# Patient Record
Sex: Female | Born: 1990 | Race: White | Hispanic: No | Smoking: Never smoker
Health system: Southern US, Community
[De-identification: ages and names within clinical notes are randomized; demographics above are authoritative.]

## PROBLEM LIST (undated history)

## (undated) DIAGNOSIS — F32A Depression, unspecified: Secondary | ICD-10-CM

## (undated) DIAGNOSIS — M859 Disorder of bone density and structure, unspecified: Secondary | ICD-10-CM

## (undated) DIAGNOSIS — F419 Anxiety disorder, unspecified: Secondary | ICD-10-CM

## (undated) DIAGNOSIS — M8580 Other specified disorders of bone density and structure, unspecified site: Secondary | ICD-10-CM

## (undated) HISTORY — PX: NASAL SINUS SURGERY: SHX719

---

## 2017-03-04 LAB — PULMONARY FUNCTION TEST

## 2017-03-19 LAB — PULMONARY FUNCTION TEST

## 2018-12-10 ENCOUNTER — Other Ambulatory Visit: Payer: Self-pay

## 2018-12-10 DIAGNOSIS — Z20822 Contact with and (suspected) exposure to covid-19: Secondary | ICD-10-CM

## 2018-12-12 LAB — NOVEL CORONAVIRUS, NAA: SARS-CoV-2, NAA: NOT DETECTED

## 2019-05-14 ENCOUNTER — Ambulatory Visit: Payer: Self-pay | Attending: Internal Medicine

## 2019-05-24 ENCOUNTER — Other Ambulatory Visit: Payer: Self-pay

## 2019-05-24 ENCOUNTER — Ambulatory Visit: Payer: Self-pay | Attending: Internal Medicine

## 2019-05-24 DIAGNOSIS — Z23 Encounter for immunization: Secondary | ICD-10-CM

## 2019-05-24 NOTE — Progress Notes (Signed)
   Covid-19 Vaccination Clinic  Name:  [name]    MRN: [id number] DOB: 07/10/1990  05/24/2019  [name] was observed post Covid-19 immunization for 15 minutes without incident. She was provided with Vaccine Information Sheet and instruction to access the V-Safe system.   [name] was instructed to call 911 with any severe reactions post vaccine: Marland Kitchen Difficulty breathing  . Swelling of face and throat  . A fast heartbeat  . A bad rash all over body  . Dizziness and weakness   Immunizations Administered    Name Date Dose VIS Date Route   Pfizer COVID-19 Vaccine 05/24/2019 11:06 AM 0.3 mL 01/22/2019 Intramuscular   Manufacturer: [employer], [employer]   Lot: [id number]   NDC: [address]-5

## 2019-06-16 ENCOUNTER — Ambulatory Visit: Payer: Self-pay | Attending: Internal Medicine

## 2019-06-16 DIAGNOSIS — Z23 Encounter for immunization: Secondary | ICD-10-CM

## 2019-06-16 NOTE — Progress Notes (Signed)
   Covid-19 Vaccination Clinic  Name:  Leslie Cervantes    MRN: [id number] DOB: 06-07-1990  06/16/2019  [name] was observed post Covid-19 immunization for 15 minutes without incident. She was provided with Vaccine Information Sheet and instruction to access the V-Safe system.   [name] was instructed to call 911 with any severe reactions post vaccine: Marland Kitchen Difficulty breathing  . Swelling of face and throat  . A fast heartbeat  . A bad rash all over body  . Dizziness and weakness   Immunizations Administered    Name Date Dose VIS Date Route   Pfizer COVID-19 Vaccine 06/16/2019  8:21 AM 0.3 mL 04/07/2018 Intramuscular   Manufacturer: [employer], [employer]   Lot: [id number]   NDC: [address]-6

## 2020-04-04 ENCOUNTER — Other Ambulatory Visit: Payer: Self-pay

## 2020-04-04 ENCOUNTER — Ambulatory Visit (HOSPITAL_COMMUNITY)
Admission: EM | Admit: 2020-04-04 | Discharge: 2020-04-04 | Disposition: A | Discharge status: Home / Self Care | Payer: 59

## 2020-04-04 ENCOUNTER — Encounter (HOSPITAL_COMMUNITY): Payer: Self-pay | Admitting: Emergency Medicine

## 2020-04-04 DIAGNOSIS — J209 Acute bronchitis, unspecified: Secondary | ICD-10-CM

## 2020-04-04 DIAGNOSIS — J3089 Other allergic rhinitis: Secondary | ICD-10-CM

## 2020-04-04 HISTORY — DX: Other specified disorders of bone density and structure, unspecified site: M85.80

## 2020-04-04 HISTORY — DX: Anxiety disorder, unspecified: F41.9

## 2020-04-04 HISTORY — DX: Disorder of bone density and structure, unspecified: M85.9

## 2020-04-04 HISTORY — DX: Depression, unspecified: F32.A

## 2020-04-04 MED ORDER — BUDESONIDE-FORMOTEROL FUMARATE 160-4.5 MCG/ACT IN AERO: 2.0000 | INHALATION_SPRAY | Freq: Two times a day (BID) | RESPIRATORY_TRACT | 3 refills | Status: DC

## 2020-04-04 MED ORDER — MONTELUKAST SODIUM 10 MG PO TABS: 10.0000 mg | ORAL_TABLET | Freq: Every day | ORAL | 2 refills | Status: DC

## 2020-04-04 MED ORDER — ALBUTEROL SULFATE HFA 108 (90 BASE) MCG/ACT IN AERS: 1.0000 | INHALATION_SPRAY | Freq: Four times a day (QID) | RESPIRATORY_TRACT | 1 refills | Status: AC | PRN

## 2020-04-04 MED ORDER — PREDNISONE 20 MG PO TABS: 40.0000 mg | ORAL_TABLET | Freq: Every day | ORAL | 0 refills | Status: DC

## 2020-04-04 NOTE — ED Triage Notes (Signed)
Cough, sinus drainage and sob intermittently for 5-6 weeks.  Symptoms worsen at night.  covid test 2 weeks ago, negative per patient

## 2020-04-04 NOTE — ED Provider Notes (Signed)
[facility]    CSN: [id number] Arrival date & time: 04/04/20  2671      History   Chief Complaint Chief Complaint  Patient presents with  . Cough    HPI Leslie Cervantes is a 30 y.o. female.   Here today with 5-6 weeks of cough, sinus drainage and pressure, intermittent episodes of SOB. Denies fever, chills, body aches, CP, abdominal pain, N/V/D. Has known hx of seasonal allergies, takes OTC antihistamine and nasal spray nearly daily. Also has an albuterol inhaler she has been using with some relief as well but notes it is expired. Has never been diagnosed with asthma that she is aware of. No known sick contacts.      Past Medical History:  Diagnosis Date  . Anxiety   . Depression   . Low bone density     There are no problems to display for this patient.   Past Surgical History:  Procedure Laterality Date  . NASAL SINUS SURGERY      OB History   No obstetric history on file.      Home Medications    Prior to Admission medications   Medication Sig Start Date End Date Taking? Authorizing Provider  albuterol (VENTOLIN HFA) 108 (90 Base) MCG/ACT inhaler Inhale 1-2 puffs into the lungs every 6 (six) hours as needed for wheezing or shortness of breath. 04/04/20  Yes Particia Nearing, PA-C  ALBUTEROL IN Inhale into the lungs.   Yes [provider]  budesonide-formoterol (SYMBICORT) 160-4.5 MCG/ACT inhaler Inhale 2 puffs into the lungs 2 (two) times daily. 04/04/20  Yes Particia Nearing, PA-C  buPROPion (WELLBUTRIN XL) 300 MG 24 hr tablet Take 300 mg by mouth daily. 02/21/20  Yes [provider]  busPIRone (BUSPAR) 10 MG tablet Take 10 mg by mouth 3 (three) times daily.   Yes [provider]  Calcium Carb-Cholecalciferol (CALCIUM 500/D PO) Take by mouth.   Yes [provider]  cetirizine (ZYRTEC) 10 MG tablet Take 10 mg by mouth daily.   Yes [provider]  fluticasone (VERAMYST) 27.5 MCG/SPRAY nasal  spray Place 2 sprays into the nose daily.   Yes [provider]  ipratropium (ATROVENT) 0.03 % nasal spray Place 2 sprays into both nostrils every 12 (twelve) hours.   Yes [provider]  montelukast (SINGULAIR) 10 MG tablet Take 1 tablet (10 mg total) by mouth at bedtime. 04/04/20  Yes Particia Nearing, PA-C  predniSONE (DELTASONE) 20 MG tablet Take 2 tablets (40 mg total) by mouth daily with breakfast. 04/04/20  Yes Particia Nearing, PA-C  sertraline (ZOLOFT) 50 MG tablet Take 50 mg by mouth daily.   Yes [provider]  VITAMIN D PO Take by mouth.   Yes [provider]    Family History No family history on file.  Social History Social History   Tobacco Use  . Smoking status: Never Smoker  Substance Use Topics  . Alcohol use: Yes  . Drug use: Never     Allergies   Patient has no known allergies.   Review of Systems Review of Systems PER HPI    Physical Exam Triage Vital Signs ED Triage Vitals  Enc Vitals Group     BP 04/04/20 0853 118/75     Pulse Rate 04/04/20 0853 93     Resp 04/04/20 0853 18     Temp 04/04/20 0853 98.3 F (36.8 C)     Temp Source 04/04/20 0853 Oral  SpO2 04/04/20 0853 100 %     Weight --      Height --      Head Circumference --      Peak Flow --      Pain Score 04/04/20 0845 0     Pain Loc --      Pain Edu? --      Excl. in GC? --    No data found.  Updated Vital Signs BP 118/75 (BP Location: Left Arm)   Pulse 93   Temp 98.3 F (36.8 C) (Oral)   Resp 18   LMP 04/04/2020   SpO2 100%   Visual Acuity Right Eye Distance:   Left Eye Distance:   Bilateral Distance:    Right Eye Near:   Left Eye Near:    Bilateral Near:     Physical Exam Vitals and nursing note reviewed.  Constitutional:      Appearance: Normal appearance. She is not ill-appearing.  HENT:     Head: Atraumatic.     Right Ear: Tympanic membrane normal.     Left Ear: Tympanic membrane normal.     Nose:  Rhinorrhea present.     Mouth/Throat:     Mouth: Mucous membranes are moist.     Pharynx: Oropharynx is clear. Posterior oropharyngeal erythema present.  Eyes:     Extraocular Movements: Extraocular movements intact.     Conjunctiva/sclera: Conjunctivae normal.  Cardiovascular:     Rate and Rhythm: Normal rate and regular rhythm.     Heart sounds: Normal heart sounds.  Pulmonary:     Effort: Pulmonary effort is normal. No respiratory distress.     Breath sounds: Normal breath sounds. No wheezing or rales.  Abdominal:     General: Bowel sounds are normal. There is no distension.     Palpations: Abdomen is soft.     Tenderness: There is no abdominal tenderness. There is no guarding.  Musculoskeletal:        General: Normal range of motion.     Cervical back: Normal range of motion and neck supple.  Skin:    General: Skin is warm and dry.  Neurological:     Mental Status: She is alert and oriented to person, place, and time.  Psychiatric:        Mood and Affect: Mood normal.        Thought Content: Thought content normal.        Judgment: Judgment normal.     UC Treatments / Results  Labs (all labs ordered are listed, but only abnormal results are displayed) Labs Reviewed - No data to display  EKG   Radiology No results found.  Procedures Procedures (including critical care time)  Medications Ordered in UC Medications - No data to display  Initial Impression / Assessment and Plan / UC Course  I have reviewed the triage vital signs and the nursing notes.  Pertinent labs & imaging results that were available during my care of the patient were reviewed by me and considered in my medical decision making (see chart for details).     Exam and vitals very reassuring today, suspect allergic rhinitis exacerbation causing her ongoing sxs. Will treat with short prednisone burst, add singulair, and increase allergy regimen to BID until improved. Albuterol inhaler sent, and  symbicort per her request for better control. Follow up with PCP for recheck of sxs.    Final Clinical Impressions(s) / UC Diagnoses   Final diagnoses:  Acute bronchitis, unspecified organism  Seasonal allergic rhinitis due to other allergic trigger   Discharge Instructions   None    ED Prescriptions    Medication Sig Dispense Auth. Provider   budesonide-formoterol (SYMBICORT) 160-4.5 MCG/ACT inhaler Inhale 2 puffs into the lungs 2 (two) times daily. 1 each Particia Nearing, PA-C   albuterol (VENTOLIN HFA) 108 (90 Base) MCG/ACT inhaler Inhale 1-2 puffs into the lungs every 6 (six) hours as needed for wheezing or shortness of breath. 18 g Particia Nearing, PA-C   montelukast (SINGULAIR) 10 MG tablet Take 1 tablet (10 mg total) by mouth at bedtime. 30 tablet Particia Nearing, [place]   predniSONE (DELTASONE) 20 MG tablet Take 2 tablets (40 mg total) by mouth daily with breakfast. 10 tablet Particia Nearing, [place]     PDMP not reviewed this encounter.   Particia Nearing, [place] 04/04/20 1731

## 2020-05-05 ENCOUNTER — Ambulatory Visit: Payer: 59 | Admitting: Internal Medicine

## 2020-05-05 ENCOUNTER — Encounter: Payer: Self-pay | Admitting: Internal Medicine

## 2020-05-05 ENCOUNTER — Ambulatory Visit (INDEPENDENT_AMBULATORY_CARE_PROVIDER_SITE_OTHER): Payer: 59 | Admitting: Internal Medicine

## 2020-05-05 ENCOUNTER — Other Ambulatory Visit: Payer: Self-pay

## 2020-05-05 DIAGNOSIS — F331 Major depressive disorder, recurrent, moderate: Secondary | ICD-10-CM

## 2020-05-05 DIAGNOSIS — F411 Generalized anxiety disorder: Secondary | ICD-10-CM

## 2020-05-05 DIAGNOSIS — R059 Cough, unspecified: Secondary | ICD-10-CM

## 2020-05-05 DIAGNOSIS — F329 Major depressive disorder, single episode, unspecified: Secondary | ICD-10-CM | POA: Insufficient documentation

## 2020-05-05 MED ORDER — VORTIOXETINE HBR 20 MG PO TABS: 10.0000 mg | ORAL_TABLET | Freq: Every day | ORAL | 3 refills | Status: DC

## 2020-05-05 MED ORDER — SERTRALINE HCL 50 MG PO TABS
50.0000 mg | ORAL_TABLET | Freq: Every day | ORAL | 0 refills | Status: DC
Start: 2020-05-05 — End: 2020-06-15

## 2020-05-05 MED ORDER — FLUTICASONE PROPIONATE 50 MCG/ACT NA SUSP: 2.0000 | Freq: Every day | NASAL | 3 refills | Status: AC

## 2020-05-05 NOTE — Patient Instructions (Addendum)
We have sent in trintellix to start taking 1/2 pill daily for the first week. Then increase to 1 pill daily thereafter. We will have you stop the sertraline (zoloft) and the buspar (buspirone) and not take this starting when you start trintellix. Keep taking the wellbutrin for [redacted] weeks along with trintellix. Then stop wellbutrin and continue on trintellix alone.  Let us know in about 2 weeks how you are doing either at a visit or with a mychart message/phone call.   [facility] to get checked out for the attention. [phone]

## 2020-05-05 NOTE — Progress Notes (Signed)
   Subjective:   Patient ID: Leslie Cervantes, female    DOB: 06-17-1990, 30 y.o.   MRN: [id number]  HPI The patient is a new 30 YO female coming in for concerns about recent diagnosis bronchitis (diagnosed at urgent care about a month ago, still coughing some, given prednisone and albuterol and symbicort, she did not get symbicort due to cost, also singulair which she is taking, overall feels better than before) and concentration problems (has an old prescription of adderall which she uses rarely, with insurance could not afford a better medication, this causes worsening anxiety and appetite suppression so she tries not to use, is currently in grad school) and depression (tried prozac and lexapro in the past which did not work, does not remember any side effects, currently taking buspar and wellbutrin and sertraline, denies true SI some thoughts of not wanting to deal with her state, no plans and would not act on this).   PMH, [facility], social history reviewed and updated  Review of Systems  Constitutional: Negative.   HENT: Positive for congestion and tinnitus.   Eyes: Negative.   Respiratory: Positive for cough. Negative for chest tightness and shortness of breath.   Cardiovascular: Negative for chest pain, palpitations and leg swelling.  Gastrointestinal: Negative for abdominal distention, abdominal pain, constipation, diarrhea, nausea and vomiting.  Musculoskeletal: Negative.   Skin: Negative.   Neurological: Negative.   Psychiatric/Behavioral: Positive for decreased concentration, dysphoric mood and sleep disturbance. The patient is nervous/anxious.     Objective:  Physical Exam Constitutional:      Appearance: She is well-developed.  HENT:     Head: Normocephalic and atraumatic.     Right Ear: Tympanic membrane normal.     Left Ear: Tympanic membrane normal.  Cardiovascular:     Rate and Rhythm: Normal rate and regular rhythm.  Pulmonary:     Effort: Pulmonary effort is normal. No  respiratory distress.     Breath sounds: Normal breath sounds. No wheezing or rales.  Abdominal:     General: Bowel sounds are normal. There is no distension.     Palpations: Abdomen is soft.     Tenderness: There is no abdominal tenderness. There is no rebound.  Musculoskeletal:     Cervical back: Normal range of motion.  Skin:    General: Skin is warm and dry.  Neurological:     Mental Status: She is alert and oriented to person, place, and time.     Coordination: Coordination normal.     Vitals:   05/05/20 1443  BP: 112/70  Pulse: 86  Resp: 18  Temp: 98.8 F (37.1 C)  TempSrc: Oral  SpO2: 96%  Weight: 117 lb 3.2 oz (53.2 kg)  Height: 5\' 4"  (1.626 m)    This visit occurred during the SARS-CoV-2 public health emergency.  Safety protocols were in place, including screening questions prior to the visit, additional usage of staff PPE, and extensive cleaning of exam room while observing appropriate contact time as indicated for disinfecting solutions.   Assessment & Plan:

## 2020-05-05 NOTE — Assessment & Plan Note (Addendum)
We will try to simplify regimen with adding trintellix. Stopping buspar and sertraline. Will keep wellbutrin for 2 weeks and then stop if able. Suspect this could be the underlying cause of the concentration and asked her to go to [place] [facility] for testing to see if she truly has ADD.

## 2020-05-05 NOTE — Assessment & Plan Note (Signed)
Rx trintellix, stop buspar and sertraline. In about 2 weeks once at full dose trintellix will stop wellbutrin. Plan for single agent therapy if able. She will reach out in 2 weeks to let us know how she is doing or sooner if needed. She was in counseling but this is not financially feasible right now.

## 2020-05-05 NOTE — Assessment & Plan Note (Signed)
Suspect post-viral and allergic. Continue zyrtec, flonase and singulair. Lungs clear on exam and x-ray not indicated. If still present in 2 months at follow up can consider.

## 2020-05-29 ENCOUNTER — Telehealth: Payer: Self-pay

## 2020-05-29 ENCOUNTER — Telehealth: Payer: Self-pay | Admitting: Internal Medicine

## 2020-05-29 NOTE — Telephone Encounter (Signed)
Noted  

## 2020-05-29 NOTE — Telephone Encounter (Signed)
vortioxetine HBr (TRINTELLIX) 20 MG TABS tablet Patient states Dr. Okey Dupre was doing a PA for this medication but she hasn't heard anything so she wanted to check on the status of the PA

## 2020-05-29 NOTE — Telephone Encounter (Signed)
PA KEY:  [id number]  Trintellix (formerly Brintellix) 20MG  tablets  Waiting on decision.  Called pt to inform her of the progress of PA.

## 2020-06-01 ENCOUNTER — Telehealth: Payer: Self-pay

## 2020-06-01 NOTE — Telephone Encounter (Signed)
Key: [id number]  Prior Auth for vortioxetine 20 mg tablet was denied.  Patient notified via Mychart.

## 2020-06-02 NOTE — Telephone Encounter (Signed)
Did she fill this and start taking it?

## 2020-06-06 ENCOUNTER — Ambulatory Visit: Payer: 59 | Admitting: Internal Medicine

## 2020-06-09 NOTE — Telephone Encounter (Signed)
Patient called and was wondering what the next step is since her PA was denied. She can be reached at [phone]. Please advise

## 2020-06-15 ENCOUNTER — Other Ambulatory Visit: Payer: Self-pay | Admitting: Internal Medicine

## 2020-06-15 MED ORDER — SERTRALINE HCL 50 MG PO TABS: 50.0000 mg | ORAL_TABLET | Freq: Every day | ORAL | 2 refills | Status: DC

## 2020-06-19 ENCOUNTER — Encounter: Payer: Self-pay | Admitting: Internal Medicine

## 2020-06-19 ENCOUNTER — Ambulatory Visit: Payer: 59 | Admitting: Internal Medicine

## 2020-06-20 ENCOUNTER — Other Ambulatory Visit: Payer: Self-pay | Admitting: Internal Medicine

## 2020-06-20 NOTE — Telephone Encounter (Signed)
1.Medication Requested: buPROPion (WELLBUTRIN XL) 300 MG 24 hr tablet    2. Pharmacy (Name, Street, [place]): [facility] [address] [place] - [address]  3. On Med List: yes   4. Last Visit with PCP: 05-05-20  5. Next visit date with PCP: 07-14-20   Agent: Please be advised that RX refills may take up to 3 business days. We ask that you follow-up with your pharmacy.

## 2020-06-21 MED ORDER — BUPROPION HCL ER (XL) 300 MG PO TB24: 300.0000 mg | ORAL_TABLET | Freq: Every day | ORAL | 2 refills | Status: DC

## 2020-06-23 NOTE — Telephone Encounter (Signed)
Patient called and is requesting a call back in regards to the message below

## 2020-06-29 ENCOUNTER — Other Ambulatory Visit: Payer: Self-pay | Admitting: Family Medicine

## 2020-06-29 NOTE — Telephone Encounter (Signed)
   Notes to clinic Not a provider we approve rx for.  

## 2020-07-12 NOTE — Telephone Encounter (Signed)
See request, thanks

## 2020-07-14 ENCOUNTER — Encounter: Payer: Self-pay | Admitting: Internal Medicine

## 2020-07-14 ENCOUNTER — Ambulatory Visit (INDEPENDENT_AMBULATORY_CARE_PROVIDER_SITE_OTHER): Payer: 59 | Admitting: Internal Medicine

## 2020-07-14 ENCOUNTER — Other Ambulatory Visit: Payer: Self-pay

## 2020-07-14 VITALS — BP 108/80 | HR 94 | Temp 98.1°F | Resp 18 | Ht 64.0 in | Wt 117.2 lb

## 2020-07-14 DIAGNOSIS — F331 Major depressive disorder, recurrent, moderate: Secondary | ICD-10-CM

## 2020-07-14 DIAGNOSIS — J3089 Other allergic rhinitis: Secondary | ICD-10-CM

## 2020-07-14 MED ORDER — SERTRALINE HCL 50 MG PO TABS: 100.0000 mg | ORAL_TABLET | Freq: Every day | ORAL | 5 refills | Status: DC

## 2020-07-14 NOTE — Progress Notes (Signed)
   Subjective:   Patient ID: Leslie Cervantes, female    DOB: 04-24-1990, 30 y.o.   MRN: [id number]  HPI The patient is a 30 YO female coming in for anxiety/depression. We had tried to get trintellix but this was not approved by insurance. We then decided to go up on wellbutrin to 450 mg and keep zoloft 50 mg daily. She has done this for a week or so. This has not changed her symptoms. She would like to maybe get off wellbutrin as she does not feel it is working great for her. Has been on prozac and lexapro in the past without relief.   Review of Systems  Constitutional: Negative.   HENT: Negative.   Eyes: Negative.   Respiratory: Negative for cough, chest tightness and shortness of breath.   Cardiovascular: Negative for chest pain, palpitations and leg swelling.  Gastrointestinal: Negative for abdominal distention, abdominal pain, constipation, diarrhea, nausea and vomiting.  Musculoskeletal: Negative.   Skin: Negative.   Neurological: Negative.   Psychiatric/Behavioral: Positive for decreased concentration and dysphoric mood.    Objective:  Physical Exam Constitutional:      Appearance: Normal appearance.  HENT:     Head: Normocephalic and atraumatic.  Pulmonary:     Effort: Pulmonary effort is normal.  Abdominal:     General: There is no distension.  Musculoskeletal:     Cervical back: Normal range of motion.  Skin:    General: Skin is warm and dry.  Neurological:     General: No focal deficit present.     Mental Status: She is alert and oriented to person, place, and time.  Psychiatric:        Mood and Affect: Mood normal.        Behavior: Behavior normal.     Vitals:   07/14/20 1103  BP: 108/80  Pulse: 94  Resp: 18  Temp: 98.1 F (36.7 C)  TempSrc: Oral  SpO2: 98%  Weight: 117 lb 3.2 oz (53.2 kg)  Height: 5\' 4"  (1.626 m)    This visit occurred during the SARS-CoV-2 public health emergency.  Safety protocols were in place, including screening questions prior  to the visit, additional usage of staff PPE, and extensive cleaning of exam room while observing appropriate contact time as indicated for disinfecting solutions.   Assessment & Plan:

## 2020-07-14 NOTE — Assessment & Plan Note (Signed)
Will decrease wellbutrin to 300 mg daily and increase zoloft to 100 mg daily. She will follow up with [place] in 2-3 weeks and we can make further adjustments as needed.

## 2020-07-14 NOTE — Patient Instructions (Signed)
We will have you go down to 300 mg of the wellbutrin and double the zoloft to take 100 mg daily of this.  Give this 3-4 weeks and then let us know how you are doing. Sooner if not doing well.

## 2020-07-24 ENCOUNTER — Encounter: Payer: Self-pay | Admitting: Internal Medicine

## 2020-07-25 MED ORDER — MONTELUKAST SODIUM 10 MG PO TABS: ORAL_TABLET | ORAL | 2 refills | Status: DC

## 2020-09-11 ENCOUNTER — Ambulatory Visit (INDEPENDENT_AMBULATORY_CARE_PROVIDER_SITE_OTHER): Payer: 59 | Admitting: Internal Medicine

## 2020-09-11 ENCOUNTER — Encounter: Payer: Self-pay | Admitting: Internal Medicine

## 2020-09-11 ENCOUNTER — Other Ambulatory Visit: Payer: Self-pay

## 2020-09-11 DIAGNOSIS — F331 Major depressive disorder, recurrent, moderate: Secondary | ICD-10-CM

## 2020-09-11 DIAGNOSIS — F411 Generalized anxiety disorder: Secondary | ICD-10-CM | POA: Diagnosis not present

## 2020-09-11 MED ORDER — BUPROPION HCL ER (XL) 150 MG PO TB24: 150.0000 mg | ORAL_TABLET | Freq: Every day | ORAL | 2 refills | Status: DC

## 2020-09-11 MED ORDER — SERTRALINE HCL 50 MG PO TABS: 150.0000 mg | ORAL_TABLET | Freq: Every day | ORAL | 5 refills | Status: DC

## 2020-09-11 NOTE — Progress Notes (Signed)
   Subjective:   Patient ID: Leslie Cervantes, female    DOB: 1990-08-29, 30 y.o.   MRN: [id number]  HPI The patient is a 30 YO coming in for follow up anxiety/depression treatment. We did increase zoloft to 100 mg daily and decrease wellbutrin to 300 mg daily last visit. She has not noticed positive or negative change since then. She was hoping to simplify regimen to 1 medication if possible. Insurance did not approve trintellix. Has tried previously lexapro and prozac.   Review of Systems  Constitutional: Negative.   HENT: Negative.    Eyes: Negative.   Respiratory:  Negative for cough, chest tightness and shortness of breath.   Cardiovascular:  Negative for chest pain, palpitations and leg swelling.  Gastrointestinal:  Negative for abdominal distention, abdominal pain, constipation, diarrhea, nausea and vomiting.  Musculoskeletal: Negative.   Skin: Negative.   Neurological: Negative.   Psychiatric/Behavioral:  Positive for dysphoric mood. The patient is nervous/anxious.    Objective:  Physical Exam Constitutional:      Appearance: She is well-developed.  HENT:     Head: Normocephalic and atraumatic.  Cardiovascular:     Rate and Rhythm: Normal rate and regular rhythm.  Pulmonary:     Effort: Pulmonary effort is normal. No respiratory distress.     Breath sounds: Normal breath sounds. No wheezing or rales.  Abdominal:     General: Bowel sounds are normal. There is no distension.     Palpations: Abdomen is soft.     Tenderness: There is no abdominal tenderness. There is no rebound.  Musculoskeletal:     Cervical back: Normal range of motion.  Skin:    General: Skin is warm and dry.  Neurological:     Mental Status: She is alert and oriented to person, place, and time.     Coordination: Coordination normal.    Vitals:   09/11/20 0928  BP: 122/80  Pulse: 73  Resp: 18  Temp: 97.7 F (36.5 C)  TempSrc: Oral  SpO2: 99%  Weight: 121 lb (54.9 kg)  Height: 5\' 4"  (1.626 m)     This visit occurred during the SARS-CoV-2 public health emergency.  Safety protocols were in place, including screening questions prior to the visit, additional usage of staff PPE, and extensive cleaning of exam room while observing appropriate contact time as indicated for disinfecting solutions.   Assessment & Plan:

## 2020-09-11 NOTE — Assessment & Plan Note (Addendum)
Not controlled with moderate exacerbation. We are decreasing wellbutrin to 150 mg daily with plans to wean off. We are increasing zoloft to 150 mg daily and can increase further if needed.

## 2020-09-11 NOTE — Assessment & Plan Note (Addendum)
Not controlled and mild to moderate symptoms. Will decrease wellbutrin to 150 mg daily and increase zoloft to 150 mg daily. In 3-4 weeks she will let us know via message how she is doing. Plan to stop wellbutrin if able and increase zoloft to 200 mg if needed. If this is not effective we can change or adjust as needed.

## 2020-09-11 NOTE — Patient Instructions (Addendum)
We will go down on the wellbutrin to 150 mg daily and go up on the zoloft to 150 mg daily.   In 3-4 weeks if doing well keep things the same.   Let us know how you are doing and we can adjust the dosing.  For the wellbutrin after 3-4 weeks of 150 mg daily you can go to 1/2 pill daily for 2-3 weeks and then stop.

## 2020-09-28 ENCOUNTER — Other Ambulatory Visit: Payer: Self-pay

## 2020-09-28 ENCOUNTER — Ambulatory Visit: Payer: 59 | Admitting: Allergy & Immunology

## 2020-09-28 VITALS — BP 98/62 | HR 102 | Temp 98.4°F | Resp 16 | Ht 64.0 in | Wt 121.0 lb

## 2020-09-28 DIAGNOSIS — J310 Chronic rhinitis: Secondary | ICD-10-CM | POA: Diagnosis not present

## 2020-09-28 DIAGNOSIS — J4520 Mild intermittent asthma, uncomplicated: Secondary | ICD-10-CM

## 2020-09-28 DIAGNOSIS — J3089 Other allergic rhinitis: Secondary | ICD-10-CM

## 2020-09-28 MED ORDER — CARBINOXAMINE MALEATE 6 MG PO TABS: 1.0000 | ORAL_TABLET | Freq: Four times a day (QID) | ORAL | 5 refills | Status: DC | PRN

## 2020-09-28 MED ORDER — CETIRIZINE HCL 10 MG PO TABS: 10.0000 mg | ORAL_TABLET | Freq: Two times a day (BID) | ORAL | 5 refills | Status: AC | PRN

## 2020-09-28 MED ORDER — XHANCE 93 MCG/ACT NA EXHU: 2.0000 | INHALANT_SUSPENSION | Freq: Two times a day (BID) | NASAL | 5 refills | Status: AC

## 2020-09-28 NOTE — Patient Instructions (Addendum)
1. Mild intermittent asthma, uncomplicated - Lung testing looked great today. - We are not going to make any changes today. - Continue with albuterol 4 puffs every 4-6 hours as needed for shortness of breath/wheezing.   2. Chronic rhinitis - Testing today showed: indoor molds - Copy of test results provided.  - Avoidance measures provided. - Stop taking: your current nose sprays and cetirizine - Continue with: Singulair (montelukast) 10mg  daily - Start taking: Ryvent (carbinoxamine) 6mg  tablet 3-4 times daily as needed and Xhance (fluticasone) 1-2 sprays per nostril twice daily - Both of these are sent to mail order pharmacies. - You can use an extra dose of the antihistamine, if needed, for breakthrough symptoms.  - Consider nasal saline rinses 1-2 times daily to remove allergens from the nasal cavities as well as help with mucous clearance (this is especially helpful to do before the nasal sprays are given)  3. Return in about 3 months (around 12/29/2020).    Please inform of any Emergency Department visits, hospitalizations, or changes in symptoms. Call 12/31/2020 before going to the ED for breathing or allergy symptoms since we might be able to fit you in for a sick visit. Feel free to contact us anytime with any questions, problems, or concerns.  It was a pleasure to meet you today!  Websites that have reliable patient information: 1. American Academy of Asthma, Allergy, and Immunology: www.aaaai.org 2. Food Allergy Research and Education (FARE): foodallergy.org 3. Mothers of Asthmatics: http://www.asthmacommunitynetwork.org 4. American College of Allergy, Asthma, and Immunology: www.acaai.org   COVID-19 Vaccine Information can be found at: [place] For questions related to vaccine distribution or appointments, please email [email or web address] or call [phone].   We realize that you might be concerned about  having an allergic reaction to the COVID19 vaccines. To help with that concern, WE ARE OFFERING THE COVID19 VACCINES IN OUR OFFICE! Ask the front desk for dates!     "Like" [email or web address] on Facebook and Instagram for our latest updates!      A healthy democracy works best when [phone] participate! Make sure you are registered to vote! If you have moved or changed any of your contact information, you will need to get this updated before voting!  In some cases, you MAY be able to register to vote online: [place]     1. Control-Buffer 50% Glycerol Negative   2. Control-Histamine 1 mg/ml 2+   3. Albumin saline Negative   4. Bahia Negative   5. Applied Materials Negative   6. Johnson Negative   7. Kentucky Blue Negative   8. Meadow Fescue Negative   9. Perennial Rye Negative   10. Sweet Vernal Negative   11. Timothy Negative   12. Cocklebur Negative   13. Burweed Marshelder Negative   14. Ragweed, short Negative   15. Ragweed, Giant Negative   16. Plantain,  English Negative   17. Lamb's Quarters Negative   18. Sheep Sorrell Negative   19. Rough Pigweed Negative   20. Marsh Elder, Rough Negative   21. Mugwort, Common Negative   22. Ash mix Negative   23. Birch mix Negative   24. Beech American Negative   25. Box, Elder Negative   26. Cedar, red Negative   27. Cottonwood, [email or web address] Negative   28. Elm mix Negative   29. Hickory Negative   30. Maple mix Negative   31. Oak, [place] mix Negative   32. Pecan Pollen Negative   33. Pine mix Negative   34. Sycamore Guinea-Bissau  Negative   35. Walnut, Black Pollen Negative   36. Alternaria alternata Negative   37. Cladosporium Herbarum Negative   38. Aspergillus mix Negative   39. Penicillium mix Negative   40. Bipolaris sorokiniana (Helminthosporium) Negative   41. Drechslera spicifera (Curvularia) Negative   42. Mucor plumbeus Negative   43. Fusarium moniliforme Negative   44. Aureobasidium pullulans  (pullulara) Negative   45. Rhizopus oryzae Negative   46. Botrytis cinera Negative   47. Epicoccum nigrum Negative   48. Phoma betae Negative   49. Candida Albicans Negative   50. Trichophyton mentagrophytes Negative   51. Mite, D Farinae  5,000 AU/ml Negative   52. Mite, D Pteronyssinus  5,000 AU/ml Negative   53. Cat Hair 10,000 BAU/ml Negative   54.  Dog Epithelia Negative   55. Mixed Feathers Negative   56. Horse Epithelia Negative   57. Cockroach, German Negative   58. Mouse Negative   59. Tobacco Leaf Negative     Control Negative   French [place] Territ[place] Negative   Johnson Negative   7 Grass Negative   Ragweed mix Negative   Weed mix Negative   Tree mix Negative   Mold 1 Negative   Mold 2 Negative   Mold 3 Negative   Mold 4 2+   Cat Negative   Dog Negative   Cockroach Negative   Mite mix Negative    Control of Mold Allergen   Mold and fungi can grow on a variety of surfaces provided certain temperature and moisture conditions exist.  Outdoor molds grow on plants, decaying vegetation and soil.  The major outdoor mold, Alternaria and Cladosporium, are found in very high numbers during hot and dry conditions.  Generally, a late Summer - Fall peak is seen for common outdoor fungal spores.  Rain will temporarily lower outdoor mold spore count, but counts rise rapidly when the rainy period ends.  The most important indoor molds are Aspergillus and Penicillium.  Dark, humid and poorly ventilated basements are ideal sites for mold growth.  The next most common sites of mold growth are the bathroom and the kitchen.  Indoor (Perennial) Mold Control   Positive indoor molds via skin testing: Fusarium, Aureobasidium (Pullulara), and Rhizopus  Maintain humidity below 50%. Clean washable surfaces with 5% bleach solution. Remove sources e.g. contaminated carpets.

## 2020-09-28 NOTE — Progress Notes (Signed)
NEW PATIENT  Date of Service/Encounter:  09/28/20  Consult requested by: Myrlene Broker, MD   Assessment:   Mild intermittent asthma, uncomplicated  Chronic rhinitis (indoor molds with minimal reactivity)  Plan/Recommendations:    1. Mild intermittent asthma, uncomplicated - Lung testing looked great today. - We are not going to make any changes today. - Continue with albuterol 4 puffs every 4-6 hours as needed for shortness of breath/wheezing.   2. Chronic rhinitis - Testing today showed: indoor molds - Copy of test results provided.  - Avoidance measures provided. - Stop taking: your current nose sprays and cetirizine - Continue with: Singulair (montelukast)  daily - Start taking: Ryvent (carbinoxamine)  tablet 3-4 times daily as needed and Xhance (fluticasone) 1-2 sprays per nostril twice daily - Both of these are sent to mail order pharmacies. - You can use an extra dose of the antihistamine, if needed, for breakthrough symptoms.  - Consider nasal saline rinses 1-2 times daily to remove allergens from the nasal cavities as well as help with mucous clearance (this is especially helpful to do before the nasal sprays are given)  3. Return in about 3 months (around 12/29/2020).    This note in its entirety was forwarded to the Provider who requested this consultation.  Subjective:   Leslie Cervantes is a 30 y.o. female presenting today for evaluation of  Chief Complaint  Patient presents with   Establish Care    Patient reports that she has had sinus problems for about 10 years. She stopped eating dairy about 2 years ago and it got better (some)    Leslie Cervantes has a history of the following: Patient Active Problem List   Diagnosis Date Noted   Mild intermittent asthma, uncomplicated 09/30/2020   Perennial allergic rhinitis 09/30/2020   GAD (generalized anxiety disorder) 05/05/2020   Major depression 05/05/2020   Cough 05/05/2020    History  obtained from: chart review and patient.  Leslie Cervantes was referred by Myrlene Broker, MD.     Leslie Cervantes is a 30 y.o. female presenting for an evaluation of environmental allergies .   Asthma/Respiratory Symptom History: She had full pulmonary function testing done in [place], but she was having issues with running and was diagnosed with exercise induced bronchospasm.  She is on albuterol 2 puffs every 4-6 hours as needed. She did have a lot of coughing in March and was given prednisone with improvement. She did not have a CXR. She is now using the albuterol a couple of times per week, sometimes with exercise but sometimes due to SOB which is relieved with the albuterol. She does not cough when she is on all of the medications. The postnasal drip tends to make her coughing the worst.   Allergic Rhinitis Symptom History: She has been on the same regimen for a few years. The montelukast is relatively new .  She is currently on Flonase 2 sprays per nostril daily as well as ipratropium 1 spray per nostril daily as needed.  She also is on cetirizine and montelukast. She has tried a few other nasal sprays including azelastine which she did not use consistently due to the taste. She has had problems for a decade or so. She had surgery 4 years ago for a turbinate reduction treatment. She is always having a lot of postnasal drip. She also has issues in the middle of the winter. She has had a cat for two years but she was having issues prior to that.  She did get sinus infections and was getting them 2-3 times per year. She has not had an antibiotic for sinus issues in ages. She was tested around one decade ago in [place].   Food Allergy Symptom History: She does avoid milk due to mucous thickening. Otherwise she eats everything without any problems. She is vegan. She was vegetarian for 7 years and then she did vegan for 2.5 years.   Her boyfriend works for [employer] as an NP for an office in [place].  They have been here for 2.5 years.   Otherwise, there is no history of other atopic diseases, including drug allergies, stinging insect allergies, urticaria, or contact dermatitis. There is no significant infectious history. Vaccinations are up to date.    Past Medical History: Patient Active Problem List   Diagnosis Date Noted   Mild intermittent asthma, uncomplicated 09/30/2020   Perennial allergic rhinitis 09/30/2020   GAD (generalized anxiety disorder) 05/05/2020   Major depression 05/05/2020   Cough 05/05/2020    Medication List:  Allergies as of 09/28/2020   No Known Allergies      Medication List        Accurate as of September 28, 2020 11:59 PM. If you have any questions, ask your nurse or doctor.          albuterol 108 (90 Base) MCG/ACT inhaler Commonly known as: VENTOLIN HFA Inhale 1-2 puffs into the lungs every 6 (six) hours as needed for wheezing or shortness of breath.   ALBUTEROL IN Inhale into the lungs.   buPROPion 150 MG 24 hr tablet Commonly known as: WELLBUTRIN XL Take 1 tablet (150 mg total) by mouth daily.   CALCIUM 500/D PO Take by mouth.   Carbinoxamine Maleate 6 MG Tabs Commonly known as: RyVent Take 1 tablet by mouth 4 (four) times daily as needed. Started by: Alfonse Spruce, MD   cetirizine 10 MG tablet Commonly known as: ZYRTEC Take 1 tablet (10 mg total) by mouth 2 (two) times daily as needed for allergies. What changed:  when to take this reasons to take this Changed by: Alfonse Spruce, MD   fluticasone 50 MCG/ACT nasal spray Commonly known as: FLONASE Place 2 sprays into both nostrils daily. What changed: Another medication with the same name was added. Make sure you understand how and when to take each. Changed by: Alfonse Spruce, MD   Charmayne Sheer MCG/ACT Exhu Generic drug: Fluticasone Propionate Place 2 sprays into the nose in the morning and at bedtime. What changed: You were already taking a medication  with the same name, and this prescription was added. Make sure you understand how and when to take each. Changed by: Alfonse Spruce, MD   ipratropium 0.03 % nasal spray Commonly known as: ATROVENT Place 2 sprays into both nostrils every 12 (twelve) hours.   montelukast 10 MG tablet Commonly known as: SINGULAIR TAKE 1 TABLET BY MOUTH EVERYDAY AT BEDTIME   sertraline 50 MG tablet Commonly known as: ZOLOFT Take 3 tablets (150 mg total) by mouth daily.   VITAMIN D PO Take by mouth.        Birth History: non-contributory  Developmental History: non-contributory  Past Surgical History: Past Surgical History:  Procedure Laterality Date   NASAL SINUS SURGERY       Family History: No family history on file.   Social History: Leslie Cervantes lives at home with her boyfriend.  She has an apartment of unknown age.  There is carpeting throughout the home.  She has electric heating and central cooling.  There is 1 cat inside of the home.  There are no dust mite covers on the bedding.  There is no tobacco exposure.  She is currently a Gaffer.  She does not use a HEPA filter.  She is not exposed to fumes, chemicals, or dust. 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 She has one more year of school. [place]    Review of Systems  Constitutional: Negative.  Negative for fever, malaise/fatigue and weight loss.  HENT:  Positive for congestion. Negative for ear discharge and ear pain.   Eyes:  Negative for pain, discharge and redness.  Respiratory:  Negative for cough, sputum production, shortness of breath and wheezing.   Cardiovascular: Negative.  Negative for chest pain and palpitations.  Gastrointestinal:  Negative for abdominal pain, heartburn, nausea and vomiting.  Skin: Negative.  Negative for itching and rash.  Neurological:  Negative for dizziness and headaches.  Endo/Heme/Allergies:  Positive for environmental  allergies. Does not bruise/bleed easily.      Objective:   Blood pressure 98/62, pulse (!) 102, temperature 98.4 F (36.9 C), temperature source Temporal, resp. rate 16, height 5\' 4"  (1.626 m), weight 121 lb (54.9 kg), SpO2 97 %. Body mass index is 20.77 kg/m.   Physical Exam:   Physical Exam Constitutional:      Appearance: She is well-developed.  HENT:     Head: Normocephalic and atraumatic.     Right Ear: Tympanic membrane, ear canal and external ear normal. No drainage, swelling or tenderness. Tympanic membrane is not injected, scarred, erythematous, retracted or bulging.     Left Ear: Tympanic membrane, ear canal and external ear normal. No drainage, swelling or tenderness. Tympanic membrane is not injected, scarred, erythematous, retracted or bulging.     Nose: No nasal deformity, septal deviation, mucosal edema or rhinorrhea.     Right Turbinates: Enlarged and swollen.     Left Turbinates: Enlarged and swollen.     Right Sinus: No maxillary sinus tenderness or frontal sinus tenderness.     Left Sinus: No maxillary sinus tenderness or frontal sinus tenderness.     Mouth/Throat:     Mouth: Mucous membranes are not pale and not dry.     Pharynx: Uvula midline.  Eyes:     General:        Right eye: No discharge.        Left eye: No discharge.     Conjunctiva/sclera: Conjunctivae normal.     Right eye: Right conjunctiva is not injected. No chemosis.    Left eye: Left conjunctiva is not injected. No chemosis.    Pupils: Pupils are equal, round, and reactive to light.  Cardiovascular:     Rate and Rhythm: Normal rate and regular rhythm.     Heart sounds: Normal heart sounds.  Pulmonary:     Effort: Pulmonary effort is normal. No tachypnea, accessory muscle usage or respiratory distress.     Breath sounds: Normal breath sounds. No wheezing, rhonchi or rales.     Comments: Moving air well in all lung fields. No increased work of breathing noted.  Chest:     Chest wall: No  tenderness.  Abdominal:     Tenderness: There is no abdominal tenderness. There is no guarding or rebound.  Lymphadenopathy:     Head:     Right side of head: No submandibular, tonsillar or occipital adenopathy.  Left side of head: No submandibular, tonsillar or occipital adenopathy.     Cervical: No cervical adenopathy.  Skin:    Coloration: Skin is not pale.     Findings: No abrasion, erythema, petechiae or rash. Rash is not papular, urticarial or vesicular.  Neurological:     Mental Status: She is alert.  Psychiatric:        Behavior: Behavior is cooperative.     Diagnostic studies:    Spirometry: results normal (FEV1: 2.84/89%, FVC: 3.62/95%, FEV1/FVC: 78%).    Spirometry consistent with normal pattern.   Allergy Studies:     Airborne Adult Perc - 09/28/20 1414     Time Antigen Placed 1414    Allergen Manufacturer Waynette Buttery    Location Back    Number of Test 59    1. Control-Buffer 50% Glycerol Negative    2. Control-Histamine 1 mg/ml 2+    3. Albumin saline Negative    4. Bahia Negative    5. French Southern Territories Negative    6. Johnson Negative    7. Kentucky Blue Negative    8. Meadow Fescue Negative    9. Perennial Rye Negative    10. Sweet Vernal Negative    11. Timothy Negative    12. Cocklebur Negative    13. Burweed Marshelder Negative    14. Ragweed, short Negative    15. Ragweed, Giant Negative    16. Plantain,  English Negative    17. Lamb's Quarters Negative    18. Sheep Sorrell Negative    19. Rough Pigweed Negative    20. Marsh Elder, Rough Negative    21. Mugwort, Common Negative    22. Ash mix Negative    23. Birch mix Negative    24. Beech American Negative    25. Box, Elder Negative    26. Cedar, red Negative    27. Cottonwood, Guinea-Bissau Negative    28. Elm mix Negative    29. Hickory Negative    30. Maple mix Negative    31. Oak, [place]issau mix Negative    32. Pecan Pollen Negative    33. Pine mix Negative    34. Sycamore Eastern Negative    35.  Walnut, Black Pollen Negative    36. Alternaria alternata Negative    37. Cladosporium Herbarum Negative    38. Aspergillus mix Negative    39. Penicillium mix Negative    40. Bipolaris sorokiniana (Helminthosporium) Negative    41. Drechslera spicifera (Curvularia) Negative    42. Mucor plumbeus Negative    43. Fusarium moniliforme Negative    44. Aureobasidium pullulans (pullulara) Negative    45. Rhizopus oryzae Negative    46. Botrytis cinera Negative    47. Epicoccum nigrum Negative    48. Phoma betae Negative    49. Candida Albicans Negative    50. Trichophyton mentagrophytes Negative    51. Mite, D Farinae  5,000 AU/ml Negative    52. Mite, D Pteronyssinus  5,000 AU/ml Negative    53. Cat Hair 10,000 BAU/ml Negative    54.  Dog Epithelia Negative    55. Mixed Feathers Negative    56. Horse Epithelia Negative    57. Cockroach, German Negative    58. Mouse Negative    59. Tobacco Leaf Negative             Intradermal - 09/28/20 1442     Time Antigen Placed 1442    Allergen Manufacturer Waynette Buttery    Location Arm  Number of Test 15    Control Negative    French Southern TerritoriesBermuda Negative    Johnson Negative    7 Grass Negative    Ragweed mix Negative    Weed mix Negative    Tree mix Negative    Mold 1 Negative    Mold 2 Negative    Mold 3 Negative    Mold 4 2+    Cat Negative    Dog Negative    Cockroach Negative    Mite mix Negative             Allergy testing results were read and interpreted by myself, documented by clinical staff.         Malachi BondsJoel Alysia Scism, MD [facility]

## 2020-09-30 ENCOUNTER — Encounter: Payer: Self-pay | Admitting: Allergy & Immunology

## 2020-09-30 DIAGNOSIS — J3089 Other allergic rhinitis: Secondary | ICD-10-CM | POA: Insufficient documentation

## 2020-09-30 DIAGNOSIS — J4520 Mild intermittent asthma, uncomplicated: Secondary | ICD-10-CM | POA: Insufficient documentation

## 2020-10-03 ENCOUNTER — Other Ambulatory Visit: Payer: Self-pay | Admitting: *Deleted

## 2020-10-03 ENCOUNTER — Telehealth: Payer: 59 | Admitting: *Deleted

## 2020-10-03 NOTE — Telephone Encounter (Signed)
Patient's insurance does not cover Ryvent 6mg  but it does cover Carbinoxamine 4mg . Can we possibly switch to this medication?

## 2020-10-04 NOTE — Telephone Encounter (Signed)
Noted.  That is fine with me.  Malachi Bonds, MD [facility]

## 2020-10-05 MED ORDER — CARBINOXAMINE MALEATE 4 MG PO TABS: 1.0000 | ORAL_TABLET | Freq: Four times a day (QID) | ORAL | 5 refills | Status: AC | PRN

## 2020-10-05 NOTE — Addendum Note (Signed)
Addended by: Robet Leu A on: 10/05/2020 10:52 AM   Modules accepted: Orders

## 2020-10-05 NOTE — Telephone Encounter (Signed)
Called and left a message for patient to call our office back to inform her about her medication change.

## 2020-10-17 ENCOUNTER — Encounter: Payer: Self-pay | Admitting: Internal Medicine

## 2020-10-19 MED ORDER — DESVENLAFAXINE SUCCINATE ER 50 MG PO TB24: 50.0000 mg | ORAL_TABLET | Freq: Every day | ORAL | 3 refills | Status: DC

## 2020-12-04 ENCOUNTER — Other Ambulatory Visit: Payer: Self-pay

## 2020-12-04 ENCOUNTER — Encounter: Payer: Self-pay | Admitting: Internal Medicine

## 2020-12-04 ENCOUNTER — Ambulatory Visit (INDEPENDENT_AMBULATORY_CARE_PROVIDER_SITE_OTHER): Payer: 59 | Admitting: Internal Medicine

## 2020-12-04 DIAGNOSIS — F411 Generalized anxiety disorder: Secondary | ICD-10-CM

## 2020-12-04 DIAGNOSIS — F331 Major depressive disorder, recurrent, moderate: Secondary | ICD-10-CM | POA: Diagnosis not present

## 2020-12-04 MED ORDER — TRAZODONE HCL 50 MG PO TABS
50.0000 mg | ORAL_TABLET | Freq: Every evening | ORAL | 3 refills | Status: AC | PRN
Start: 2020-12-04 — End: ?

## 2020-12-04 MED ORDER — ARIPIPRAZOLE 2 MG PO TABS: 2.0000 mg | ORAL_TABLET | Freq: Every day | ORAL | 6 refills | Status: DC

## 2020-12-04 NOTE — Patient Instructions (Addendum)
We have sent in the abilify to start taking 1 pill daily. This should work in 1-2 weeks.  We have sent in trazodone to use 1 pill nightly for the first week. If this is working stay there. If not helping go up to 2 pills at night time and give this a week or so. Then check in with [place] on how you are doing.  Stop taking the pristiq and keep taking zoloft.   Call the insurance company or go online to find a psychiatrist in network then you can call for an appointment.

## 2020-12-04 NOTE — Assessment & Plan Note (Signed)
Not controlled and overall worse due to side effects of pristiq. Will stop this. She will continue on zoloft 150 mg daily. She is asked to find out if any psychiatrists are in network and contact them for assistance. She has tried and failed lexapro, pristiq, prozac, wellbutrin. We will add abilify 2 mg daily and continue zoloft 150 mg daily. We are also adding trazodone 50 mg qhs for sleep.

## 2020-12-04 NOTE — Assessment & Plan Note (Signed)
Causing problems with sleep which is chronic and worse due to pristiq currently. Will stop pristiq. Rx trazodone 50 mg qhs for 1 week then increase to 100 mg qhs if needed for sleep. Continue zoloft 150 mg daily and we are adding abilify for uncontrolled symptoms which are moderate.

## 2020-12-04 NOTE — Progress Notes (Signed)
   Subjective:   Patient ID: Leslie Cervantes, female    DOB: 1990-03-11, 30 y.o.   MRN: [id number]  HPI The patient is a 30 YO female coming in for med adjustment. We had made changes since last visit. Start pristiq about 3-4 weeks ago at 50 mg daily. This is causing side effects.   Review of Systems  Constitutional: Negative.   HENT: Negative.    Eyes: Negative.   Respiratory:  Negative for cough, chest tightness and shortness of breath.   Cardiovascular:  Negative for chest pain, palpitations and leg swelling.  Gastrointestinal:  Negative for abdominal distention, abdominal pain, constipation, diarrhea, nausea and vomiting.  Musculoskeletal: Negative.   Skin: Negative.   Neurological: Negative.   Psychiatric/Behavioral:  Positive for decreased concentration, dysphoric mood and sleep disturbance. The patient is nervous/anxious.    Objective:  Physical Exam Constitutional:      Appearance: She is well-developed.  HENT:     Head: Normocephalic and atraumatic.  Cardiovascular:     Rate and Rhythm: Normal rate.  Pulmonary:     Effort: Pulmonary effort is normal. No respiratory distress.  Abdominal:     General: There is no distension.     Palpations: Abdomen is soft.  Musculoskeletal:     Cervical back: Normal range of motion.  Skin:    General: Skin is warm and dry.  Neurological:     Mental Status: She is alert and oriented to person, place, and time.     Coordination: Coordination normal.    Vitals:   12/04/20 0912  BP: 116/70  Pulse: 79  Resp: 18  SpO2: 98%  Weight: 127 lb 3.2 oz (57.7 kg)  Height: 5\' 4"  (1.626 m)    This visit occurred during the SARS-CoV-2 public health emergency.  Safety protocols were in place, including screening questions prior to the visit, additional usage of staff PPE, and extensive cleaning of exam room while observing appropriate contact time as indicated for disinfecting solutions.   Assessment & Plan:  Visit time 20 minutes in face to  face communication with patient and coordination of care, additional 10 minutes spent in record review, coordination or care, ordering tests, communicating/referring to other healthcare professionals, documenting in medical records all on the same day of the visit for total time 30 minutes spent on the visit.

## 2020-12-24 ENCOUNTER — Other Ambulatory Visit: Payer: Self-pay

## 2020-12-24 ENCOUNTER — Ambulatory Visit
Admission: EM | Admit: 2020-12-24 | Discharge: 2020-12-24 | Disposition: A | Discharge status: Home / Self Care | Payer: 59 | Attending: Physician Assistant | Admitting: Physician Assistant

## 2020-12-24 ENCOUNTER — Encounter: Payer: Self-pay | Admitting: *Deleted

## 2020-12-24 DIAGNOSIS — J209 Acute bronchitis, unspecified: Secondary | ICD-10-CM | POA: Diagnosis not present

## 2020-12-24 MED ORDER — PREDNISONE 10 MG (21) PO TBPK: ORAL_TABLET | ORAL | 0 refills | Status: DC

## 2020-12-24 NOTE — ED Triage Notes (Signed)
Pt reports an allergy to mold and has been in her parents basement . Pt has developed wheezing ,cough and congestion. Pt has used [facility] with out relief.

## 2020-12-24 NOTE — ED Provider Notes (Signed)
[facility]    CSN: [id number] Arrival date & time: 12/24/20  0905      History   Chief Complaint Chief Complaint  Patient presents with   Sore Throat   Wheezing   Cough   Nasal Congestion    HPI Leslie Cervantes is a 30 y.o. female.   Patient here today for evaluation of cough, congestion, and wheezing that is been ongoing for the last several days.  She reports that she is currently staying in her parents basement and thinks this might be the cause of her symptoms.  She does have mild intermittent asthma, and has tried taking an inhaler without significant improvement.  She has not had fever or chills.  She denies any vomiting or diarrhea.  The history is provided by the patient.  Sore Throat Associated symptoms include shortness of breath. Pertinent negatives include no chest pain and no abdominal pain.  Wheezing Associated symptoms: cough and shortness of breath   Associated symptoms: no chest pain, no ear pain, no fever and no sore throat   Cough Associated symptoms: shortness of breath and wheezing   Associated symptoms: no chest pain, no chills, no ear pain, no eye discharge, no fever and no sore throat    Past Medical History:  Diagnosis Date   Anxiety    Depression    Low bone density     Patient Active Problem List   Diagnosis Date Noted   Mild intermittent asthma, uncomplicated 09/30/2020   Perennial allergic rhinitis 09/30/2020   GAD (generalized anxiety disorder) 05/05/2020   Major depression 05/05/2020    Past Surgical History:  Procedure Laterality Date   NASAL SINUS SURGERY      OB History   No obstetric history on file.      Home Medications    Prior to Admission medications   Medication Sig Start Date End Date Taking? Authorizing Provider  predniSONE (STERAPRED UNI-PAK 21 TAB) 10 MG (21) TBPK tablet Take 6 tabs days 1-2, 5 tabs days 3-4, 4 tabs days 5-6, 3 tabs days 7-8, 2 tabs days 9-10, 1 tab days 11-12 12/24/20  Yes  Tomi Bamberger, PA-C  albuterol (VENTOLIN HFA) 108 (90 Base) MCG/ACT inhaler Inhale 1-2 puffs into the lungs every 6 (six) hours as needed for wheezing or shortness of breath. 04/04/20   Particia Nearing, PA-C  ARIPiprazole (ABILIFY) 2 MG tablet Take 1 tablet (2 mg total) by mouth daily. 12/04/20   Myrlene Broker, MD  Calcium Carb-Cholecalciferol (CALCIUM 500/D PO) Take by mouth.    [provider]  Carbinoxamine Maleate (RYVENT) 6 MG TABS Take 1 tablet by mouth 4 (four) times daily as needed. 09/28/20   Alfonse Spruce, MD  Carbinoxamine Maleate 4 MG TABS Take 1 tablet (4 mg total) by mouth 4 (four) times daily as needed. 10/05/20   Alfonse Spruce, MD  cetirizine (ZYRTEC) 10 MG tablet Take 1 tablet (10 mg total) by mouth 2 (two) times daily as needed for allergies. 09/28/20   Alfonse Spruce, MD  fluticasone [facility]) 50 MCG/ACT nasal spray Place 2 sprays into both nostrils daily. 05/05/20   Myrlene Broker, MD  Fluticasone Propionate Timmothy Sours) 93 MCG/ACT EXHU Place 2 sprays into the nose in the morning and at bedtime. 09/28/20   Alfonse Spruce, MD  ipratropium (ATROVENT) 0.03 % nasal spray Place 2 sprays into both nostrils every 12 (twelve) hours.    [provider]  montelukast (SINGULAIR) 10 MG tablet TAKE  1 TABLET BY MOUTH EVERYDAY AT BEDTIME 07/25/20   Myrlene Broker, MD  sertraline (ZOLOFT) 50 MG tablet Take 3 tablets (150 mg total) by mouth daily. 09/11/20   Myrlene Broker, MD  traZODone (DESYREL) 50 MG tablet Take 1-2 tablets (50-100 mg total) by mouth at bedtime as needed for sleep. 12/04/20   Myrlene Broker, MD  VITAMIN D PO Take by mouth.    [provider]    Family History History reviewed. No pertinent family history.  Social History Social History   Tobacco Use   Smoking status: Never   Smokeless tobacco: Never  Substance Use Topics   Alcohol use: Yes   Drug use: Never     Allergies    Patient has no known allergies.   Review of Systems Review of Systems  Constitutional:  Negative for chills and fever.  HENT:  Negative for ear pain, sinus pressure and sore throat.   Eyes:  Negative for discharge and redness.  Respiratory:  Positive for cough, shortness of breath and wheezing.   Cardiovascular:  Negative for chest pain.  Gastrointestinal:  Negative for abdominal pain, diarrhea, nausea and vomiting.    Physical Exam Triage Vital Signs ED Triage Vitals  Enc Vitals Group     BP 12/24/20 1015 106/73     Pulse Rate 12/24/20 1015 93     Resp 12/24/20 1015 20     Temp 12/24/20 1015 98.6 F (37 C)     Temp src --      SpO2 12/24/20 1015 95 %     Weight --      Height --      Head Circumference --      Peak Flow --      Pain Score 12/24/20 1013 0     Pain Loc --      Pain Edu? --      Excl. in GC? --    No data found.  Updated Vital Signs BP 106/73   Pulse 93   Temp 98.6 F (37 C)   Resp 20   LMP 12/24/2020   SpO2 95%      Physical Exam Vitals and nursing note reviewed.  Constitutional:      General: She is not in acute distress.    Appearance: Normal appearance. She is not ill-appearing.  HENT:     Head: Normocephalic and atraumatic.  Eyes:     Conjunctiva/sclera: Conjunctivae normal.  Cardiovascular:     Rate and Rhythm: Normal rate and regular rhythm.     Heart sounds: Normal heart sounds. No murmur heard. Pulmonary:     Effort: Pulmonary effort is normal. No respiratory distress.     Breath sounds: Wheezing (scattered, mild) present. No rhonchi or rales.  Neurological:     Mental Status: She is alert.  Psychiatric:        Mood and Affect: Mood normal.        Behavior: Behavior normal.        Thought Content: Thought content normal.     UC Treatments / Results  Labs (all labs ordered are listed, but only abnormal results are displayed) Labs Reviewed - No data to display  EKG   Radiology No results  found.  Procedures Procedures (including critical care time)  Medications Ordered in UC Medications - No data to display  Initial Impression / Assessment and Plan / UC Course  I have reviewed the triage vital signs and the nursing notes.  Pertinent  labs & imaging results that were available during my care of the patient were reviewed by me and considered in my medical decision making (see chart for details).  SPECT likely bronchitis versus mild asthma exacerbation.  Patient request a longer duration of steroid treatment, as in the past prednisone burst has not been sufficient.  Steroid taper prescribed.  Encouraged follow-up if symptoms fail to improve or worsen anyway.  Continue to use inhaler as needed  Final Clinical Impressions(s) / UC Diagnoses   Final diagnoses:  Acute bronchitis, unspecified organism   Discharge Instructions   None    ED Prescriptions     Medication Sig Dispense Auth. Provider   predniSONE (STERAPRED UNI-PAK 21 TAB) 10 MG (21) TBPK tablet Take 6 tabs days 1-2, 5 tabs days 3-4, 4 tabs days 5-6, 3 tabs days 7-8, 2 tabs days 9-10, 1 tab days 11-12 42 tablet Tomi Bamberger, PA-C      PDMP not reviewed this encounter.   Tomi Bamberger, PA-C 12/24/20 1131

## 2021-01-09 ENCOUNTER — Ambulatory Visit: Payer: 59 | Admitting: Allergy & Immunology

## 2021-01-25 ENCOUNTER — Emergency Department (HOSPITAL_BASED_OUTPATIENT_CLINIC_OR_DEPARTMENT_OTHER): Payer: 59 | Admitting: Radiology

## 2021-01-25 ENCOUNTER — Encounter (HOSPITAL_BASED_OUTPATIENT_CLINIC_OR_DEPARTMENT_OTHER): Payer: Self-pay | Admitting: Emergency Medicine

## 2021-01-25 ENCOUNTER — Other Ambulatory Visit: Payer: Self-pay

## 2021-01-25 ENCOUNTER — Emergency Department (HOSPITAL_BASED_OUTPATIENT_CLINIC_OR_DEPARTMENT_OTHER)
Admission: EM | Admit: 2021-01-25 | Discharge: 2021-01-25 | Disposition: A | Discharge status: Home / Self Care | Payer: 59 | Attending: Emergency Medicine | Admitting: Emergency Medicine

## 2021-01-25 DIAGNOSIS — S299XXA Unspecified injury of thorax, initial encounter: Secondary | ICD-10-CM | POA: Diagnosis present

## 2021-01-25 DIAGNOSIS — W228XXA Striking against or struck by other objects, initial encounter: Secondary | ICD-10-CM | POA: Insufficient documentation

## 2021-01-25 DIAGNOSIS — Y9343 Activity, gymnastics: Secondary | ICD-10-CM | POA: Insufficient documentation

## 2021-01-25 DIAGNOSIS — J4520 Mild intermittent asthma, uncomplicated: Secondary | ICD-10-CM | POA: Diagnosis not present

## 2021-01-25 DIAGNOSIS — S20211A Contusion of right front wall of thorax, initial encounter: Secondary | ICD-10-CM | POA: Diagnosis not present

## 2021-01-25 MED ORDER — ACETAMINOPHEN 325 MG PO TABS
650.0000 mg | ORAL_TABLET | Freq: Once | ORAL | Status: AC
  Administered 2021-01-25: 650 mg via ORAL
  Filled 2021-01-25: qty 2

## 2021-01-25 MED ORDER — LIDO KING 4 % EX PTCH: 1.0000 | MEDICATED_PATCH | Freq: Every day | CUTANEOUS | 0 refills | Status: AC | PRN

## 2021-01-25 MED ORDER — METHOCARBAMOL 500 MG PO TABS: 1000.0000 mg | ORAL_TABLET | Freq: Two times a day (BID) | ORAL | 0 refills | Status: AC

## 2021-01-25 MED ORDER — ACETAMINOPHEN 325 MG PO TABS: 650.0000 mg | ORAL_TABLET | Freq: Four times a day (QID) | ORAL | 0 refills | Status: AC | PRN

## 2021-01-25 MED ORDER — IBUPROFEN 600 MG PO TABS: 600.0000 mg | ORAL_TABLET | Freq: Four times a day (QID) | ORAL | 0 refills | Status: AC | PRN

## 2021-01-25 MED ORDER — LIDOCAINE 5 % EX PTCH
1.0000 | MEDICATED_PATCH | CUTANEOUS | Status: DC
  Administered 2021-01-25: 1 via TRANSDERMAL
  Filled 2021-01-25: qty 1

## 2021-01-25 MED ORDER — METHOCARBAMOL 500 MG PO TABS
1000.0000 mg | ORAL_TABLET | Freq: Once | ORAL | Status: AC
  Administered 2021-01-25: 1000 mg via ORAL
  Filled 2021-01-25: qty 2

## 2021-01-25 MED ORDER — SUCRALFATE 1 G PO TABS: 1.0000 g | ORAL_TABLET | Freq: Three times a day (TID) | ORAL | 0 refills | Status: DC

## 2021-01-25 NOTE — Discharge Instructions (Addendum)
Please take motrin/ibuprofen with food. You may also take carafate/sucralfate with the motrin to reduce the chance of stomach upset

## 2021-01-25 NOTE — ED Notes (Signed)
RT Note: Pt. given/educated on Incentive Spirometer, able use independently, stressed deep breath /cough.

## 2021-01-25 NOTE — ED Triage Notes (Signed)
Pt reports right lower rib pain since injuring right side on a bar at a gymnastics class on Monday.  Pt took 600mg  ibuprofen approx 90 minutes ago with minimal relief.

## 2021-01-25 NOTE — ED Provider Notes (Addendum)
[facility] EMERGENCY DEPT Provider Note   CSN: [id number] Arrival date & time: 01/25/21  0747     History Chief Complaint  Patient presents with   Chest Pain    Leslie Cervantes is a 30 y.o. female.  This is a 30 y.o. female with significant medical history as below, including anxiety/depression who presents to the ED with complaint of right-sided chest wall trauma.  Patient reports approximately 4 days ago she was in a gymnastics class and injured her right side chest wall on a gymnastics device.  Patient has been having ongoing discomfort since then which is progressively worsened.  Pain worsened with deep inspiration and with palpation.  Worsened with torso movement.  No blood thinners.  No other reported injuries.  Took 600 mg Motrin this morning without significant improvement her symptoms.  No numbness, tingling, dyspnea, nausea and vomiting, fevers, chills, rashes, change in bowel or bladder function.  The history is provided by the patient and the spouse. No language interpreter was used.  Chest Pain Associated symptoms: no abdominal pain, no back pain, no cough, no dysphagia, no fever, no headache, no nausea, no palpitations and no shortness of breath       Past Medical History:  Diagnosis Date   Anxiety    Depression    Low bone density     Patient Active Problem List   Diagnosis Date Noted   Mild intermittent asthma, uncomplicated 09/30/2020   Perennial allergic rhinitis 09/30/2020   GAD (generalized anxiety disorder) 05/05/2020   Major depression 05/05/2020    Past Surgical History:  Procedure Laterality Date   NASAL SINUS SURGERY       OB History   No obstetric history on file.     History reviewed. No pertinent family history.  Social History   Tobacco Use   Smoking status: Never   Smokeless tobacco: Never  Substance Use Topics   Alcohol use: Yes   Drug use: Never    Home Medications Prior to Admission medications    Medication Sig Start Date End Date Taking? Authorizing Provider  acetaminophen (TYLENOL) 325 MG tablet Take 2 tablets (650 mg total) by mouth every 6 (six) hours as needed. 01/25/21  Yes Tanda Rockers A, DO  ibuprofen (ADVIL) 600 MG tablet Take 1 tablet (600 mg total) by mouth every 6 (six) hours as needed. 01/25/21  Yes Tanda Rockers A, DO  Lidocaine (LIDO KING) 4 % PTCH Apply 1 patch topically daily as needed for up to 10 doses. 01/25/21  Yes Sloan Leiter, DO  methocarbamol (ROBAXIN) 500 MG tablet Take 2 tablets (1,000 mg total) by mouth 2 (two) times daily for 5 days. 01/25/21 01/30/21 Yes Tanda Rockers A, DO  sucralfate (CARAFATE) 1 g tablet Take 1 tablet (1 g total) by mouth 4 (four) times daily -  with meals and at bedtime for 5 days. 01/25/21 01/30/21 Yes Tanda Rockers A, DO  albuterol (VENTOLIN HFA) 108 (90 Base) MCG/ACT inhaler Inhale 1-2 puffs into the lungs every 6 (six) hours as needed for wheezing or shortness of breath. 04/04/20   Particia Nearing, PA-C  ARIPiprazole (ABILIFY) 2 MG tablet Take 1 tablet (2 mg total) by mouth daily. 12/04/20   Myrlene Broker, MD  Calcium Carb-Cholecalciferol (CALCIUM 500/D PO) Take by mouth.    [provider]  Carbinoxamine Maleate (RYVENT) 6 MG TABS Take 1 tablet by mouth 4 (four) times daily as needed. 09/28/20   Alfonse Spruce, MD  Carbinoxamine Maleate 4  MG TABS Take 1 tablet (4 mg total) by mouth 4 (four) times daily as needed. 10/05/20   Alfonse Spruce, MD  cetirizine (ZYRTEC) 10 MG tablet Take 1 tablet (10 mg total) by mouth 2 (two) times daily as needed for allergies. 09/28/20   Alfonse Spruce, MD  fluticasone [facility]) 50 MCG/ACT nasal spray Place 2 sprays into both nostrils daily. 05/05/20   Myrlene Broker, MD  Fluticasone Propionate Timmothy Sours) 93 MCG/ACT EXHU Place 2 sprays into the nose in the morning and at bedtime. 09/28/20   Alfonse Spruce, MD  ipratropium (ATROVENT) 0.03 % nasal spray Place 2  sprays into both nostrils every 12 (twelve) hours.    [provider]  montelukast (SINGULAIR) 10 MG tablet TAKE 1 TABLET BY MOUTH EVERYDAY AT BEDTIME 07/25/20   Myrlene Broker, MD  predniSONE (STERAPRED UNI-PAK 21 TAB) 10 MG (21) TBPK tablet Take 6 tabs days 1-2, 5 tabs days 3-4, 4 tabs days 5-6, 3 tabs days 7-8, 2 tabs days 9-10, 1 tab days 11-12 12/24/20   Tomi Bamberger, PA-C  sertraline (ZOLOFT) 50 MG tablet Take 3 tablets (150 mg total) by mouth daily. 09/11/20   Myrlene Broker, MD  traZODone (DESYREL) 50 MG tablet Take 1-2 tablets (50-100 mg total) by mouth at bedtime as needed for sleep. 12/04/20   Myrlene Broker, MD  VITAMIN D PO Take by mouth.    [provider]    Allergies    Patient has no known allergies.  Review of Systems   Review of Systems  Constitutional:  Negative for activity change and fever.  HENT:  Negative for facial swelling and trouble swallowing.   Eyes:  Negative for discharge and redness.  Respiratory:  Negative for cough and shortness of breath.   Cardiovascular:  Positive for chest pain. Negative for palpitations.  Gastrointestinal:  Negative for abdominal pain and nausea.  Genitourinary:  Negative for dysuria and flank pain.  Musculoskeletal:  Negative for back pain and gait problem.  Skin:  Negative for pallor and rash.  Neurological:  Negative for syncope and headaches.   Physical Exam Updated Vital Signs BP 103/60 (BP Location: Right Arm)    Pulse 73    Temp 98.1 F (36.7 C) (Oral)    Resp 14    Ht 5\' 4"  (1.626 m)    Wt 56.7 kg    LMP 01/22/2021 (Exact Date)    SpO2 100%    BMI 21.46 kg/m   Physical Exam Vitals and nursing note reviewed.  Constitutional:      General: She is not in acute distress.    Appearance: Normal appearance. She is well-developed. She is not ill-appearing, toxic-appearing or diaphoretic.  HENT:     Head: Normocephalic and atraumatic.     Right Ear: External ear normal.     Left Ear:  External ear normal.     Nose: Nose normal.     Mouth/Throat:     Mouth: Mucous membranes are moist.  Eyes:     General: No scleral icterus.       Right eye: No discharge.        Left eye: No discharge.  Cardiovascular:     Rate and Rhythm: Normal rate and regular rhythm.     Pulses: Normal pulses.     Heart sounds: Normal heart sounds.  Pulmonary:     Effort: Pulmonary effort is normal. No respiratory distress.     Breath sounds: Normal breath sounds.  Chest:       Comments: Mild tenderness palpation to chest wall, no bruising, no crepitus, no deformity. Abdominal:     General: Abdomen is flat.     Palpations: Abdomen is soft.     Tenderness: There is no abdominal tenderness.  Musculoskeletal:        General: Normal range of motion.     Cervical back: Normal range of motion.     Right lower leg: No edema.     Left lower leg: No edema.  Skin:    General: Skin is warm and dry.     Capillary Refill: Capillary refill takes less than 2 seconds.  Neurological:     Mental Status: She is alert.  Psychiatric:        Mood and Affect: Mood normal.        Behavior: Behavior normal.    ED Results / Procedures / Treatments   Labs (all labs ordered are listed, but only abnormal results are displayed) Labs Reviewed - No data to display  EKG None  Radiology DG Ribs Unilateral W/Chest Right  Result Date: 01/25/2021 CLINICAL DATA:  Rib injury EXAM: RIGHT RIBS AND CHEST - 3+ VIEW COMPARISON:  None. FINDINGS: No fracture or other bone lesions are seen involving the ribs. There is no evidence of pneumothorax or pleural effusion. Both lungs are clear. Heart size and mediastinal contours are within normal limits. IMPRESSION: Negative. Electronically Signed   By: Guadlupe Spanish M.D.   On: 01/25/2021 08:45    Procedures Procedures   Medications Ordered in ED Medications  lidocaine (LIDODERM) 5 % 1 patch (has no administration in time range)  acetaminophen (TYLENOL) tablet 650 mg (has  no administration in time range)  methocarbamol (ROBAXIN) tablet 1,000 mg (has no administration in time range)    ED Course  I have reviewed the triage vital signs and the nursing notes.  Pertinent labs & imaging results that were available during my care of the patient were reviewed by me and considered in my medical decision making (see chart for details).    MDM Rules/Calculators/A&P                           CC: cp  This patient complains of above; this involves an extensive number of treatment options and is a complaint that carries with it a high risk of complications and morbidity. Vital signs were reviewed. Serious etiologies considered.  Record review:  Previous records obtained and reviewed   Additional history obtained from spouse  Work up as above, notable for:  imaging results that were available during my care of the patient were reviewed by me and considered in my medical decision making.   I ordered imaging studies which included ribs unilateral with chest x-ray and I independently visualized and interpreted imaging which showed no acute process, no fracture  Management: Give Tylenol, Robaxin, Lidoderm patch, incentive spirometer  Reassessment:  Patient feeling better.  Patient would likely rib contusion secondary to chest wall trauma.  Discussed supportive care, red flag symptoms, strict return precautions.  The patient improved significantly and was discharged in stable condition. Detailed discussions were had with the patient regarding current findings, and need for close f/u with PCP or on call doctor. The patient has been instructed to return immediately if the symptoms worsen in any way for re-evaluation. Patient verbalized understanding and is in agreement with current care plan. All questions answered prior to discharge.  This chart was dictated using voice recognition software.  Despite best efforts to proofread,  errors can occur which  can change the documentation meaning.  Final Clinical Impression(s) / ED Diagnoses Final diagnoses:  Contusion of rib on right side, initial encounter    Rx / DC Orders ED Discharge Orders          Ordered    methocarbamol (ROBAXIN) 500 MG tablet  2 times daily        01/25/21 0909    acetaminophen (TYLENOL) 325 MG tablet  Every 6 hours PRN        01/25/21 0909    ibuprofen (ADVIL) 600 MG tablet  Every 6 hours PRN        01/25/21 0909    sucralfate (CARAFATE) 1 g tablet  3 times daily with meals & bedtime        01/25/21 0909    Lidocaine (LIDO KING) 4 % PTCH  Daily PRN        01/25/21 0909             Sloan Leiter, DO 01/25/21 0914    Sloan Leiter, DO 01/25/21 [phone]

## 2021-02-20 ENCOUNTER — Ambulatory Visit: Payer: 59 | Admitting: Allergy & Immunology

## 2021-03-12 ENCOUNTER — Ambulatory Visit: Payer: Self-pay | Admitting: Family Medicine

## 2021-03-12 ENCOUNTER — Other Ambulatory Visit: Payer: Self-pay

## 2021-03-12 ENCOUNTER — Ambulatory Visit: Payer: 59 | Admitting: Physician Assistant

## 2021-03-12 ENCOUNTER — Encounter: Payer: Self-pay | Admitting: Physician Assistant

## 2021-03-12 VITALS — BP 98/66 | HR 68 | Temp 99.0°F | Ht 64.0 in | Wt 134.5 lb

## 2021-03-12 DIAGNOSIS — L0291 Cutaneous abscess, unspecified: Secondary | ICD-10-CM | POA: Diagnosis not present

## 2021-03-12 NOTE — Progress Notes (Signed)
Leslie Cervantes is a 31 y.o. female here for leg infection.  History of Present Illness:   Chief Complaint  Patient presents with   Insect Bite    Left Leg    HPI  Leg Infection Leslie Cervantes presents with c/o raised lesion on her left leg that has been onset for about a week. Initially pt believed this to be an insect bite or ingrown hair due to itchy sensation, but this sensation soon resolved. Over the past couple of days, she reports that the lesion did scab over, but was still slightly swollen and red. Due to this, her boyfriend who is an NP, opened the wound last night and attempted to drain any contents. Although there was a significant amount of drainage, Leslie Cervantes decided to still have it evaluated due to increase erythema. She has since been applying neosporin to the affected area and keeping it covered. Denies fever or chills.   Past Medical History:  Diagnosis Date   Anxiety    Depression    Low bone density      Social History   Tobacco Use   Smoking status: Never   Smokeless tobacco: Never  Substance Use Topics   Alcohol use: Yes   Drug use: Never    Past Surgical History:  Procedure Laterality Date   NASAL SINUS SURGERY      No family history on file.  No Known Allergies  Current Medications:   Current Outpatient Medications:    acetaminophen (TYLENOL) 325 MG tablet, Take 2 tablets (650 mg total) by mouth every 6 (six) hours as needed., Disp: 36 tablet, Rfl: 0   albuterol (VENTOLIN HFA) 108 (90 Base) MCG/ACT inhaler, Inhale 1-2 puffs into the lungs every 6 (six) hours as needed for wheezing or shortness of breath., Disp: 18 g, Rfl: 1   ARIPiprazole (ABILIFY) 2 MG tablet, Take 1 tablet (2 mg total) by mouth daily., Disp: 30 tablet, Rfl: 6   Calcium Carb-Cholecalciferol (CALCIUM 500/D PO), Take by mouth., Disp: , Rfl:    Carbinoxamine Maleate 4 MG TABS, Take 1 tablet (4 mg total) by mouth 4 (four) times daily as needed., Disp: 120 tablet, Rfl: 5   cetirizine  (ZYRTEC) 10 MG tablet, Take 1 tablet (10 mg total) by mouth 2 (two) times daily as needed for allergies., Disp: 60 tablet, Rfl: 5   fluticasone (FLONASE) 50 MCG/ACT nasal spray, Place 2 sprays into both nostrils daily., Disp: 48 g, Rfl: 3   Fluticasone Propionate (XHANCE) 93 MCG/ACT EXHU, Place 2 sprays into the nose in the morning and at bedtime., Disp: 16 mL, Rfl: 5   ibuprofen (ADVIL) 600 MG tablet, Take 1 tablet (600 mg total) by mouth every 6 (six) hours as needed., Disp: 30 tablet, Rfl: 0   ipratropium (ATROVENT) 0.03 % nasal spray, Place 2 sprays into both nostrils every 12 (twelve) hours., Disp: , Rfl:    Lidocaine (LIDO KING) 4 % PTCH, Apply 1 patch topically daily as needed for up to 10 doses., Disp: 10 patch, Rfl: 0   montelukast (SINGULAIR) 10 MG tablet, TAKE 1 TABLET BY MOUTH EVERYDAY AT BEDTIME, Disp: 90 tablet, Rfl: 2   sertraline (ZOLOFT) 50 MG tablet, Take 3 tablets (150 mg total) by mouth daily. (Patient taking differently: Take 100 mg by mouth daily.), Disp: 90 tablet, Rfl: 5   traZODone (DESYREL) 50 MG tablet, Take 1-2 tablets (50-100 mg total) by mouth at bedtime as needed for sleep., Disp: 60 tablet, Rfl: 3   VITAMIN D PO, Take  by mouth., Disp: , Rfl:    Review of Systems:   ROS Negative unless otherwise specified per HPI. Vitals:   Vitals:   03/12/21 1422  BP: 98/66  Pulse: 68  Temp: 99 F (37.2 C)  SpO2: 99%  Weight: 134 lb 8 oz (61 kg)  Height: 5\' 4"  (1.626 m)     Body mass index is 23.09 kg/m.  Physical Exam:   Physical Exam Constitutional:      Appearance: Normal appearance. She is well-developed.  HENT:     Head: Normocephalic and atraumatic.  Eyes:     General: Lids are normal.     Extraocular Movements: Extraocular movements intact.     Conjunctiva/sclera: Conjunctivae normal.  Pulmonary:     Effort: Pulmonary effort is normal.  Musculoskeletal:        General: Normal range of motion.     Cervical back: Normal range of motion and neck supple.   Skin:    General: Skin is warm and dry.     Comments: 2.5 cm erythematous round lesion to anterior aspect of lower left leg with scant purulent drainage, induration and fluctuance  Neurological:     Mental Status: She is alert and oriented to person, place, and time.  Psychiatric:        Attention and Perception: Attention and perception normal.        Mood and Affect: Mood normal.        Behavior: Behavior normal.        Thought Content: Thought content normal.        Judgment: Judgment normal.   Procedure: I&D  Consent: Risks and benefits of therapy discussed with patient who voices understanding and agrees with planned care. No barriers to communication or understanding identified.  After obtaining informed consent, the patient's identity, procedure, and site were verified during a pause prior to proceeding with the minor surgical procedure as per universal protocol recommendations. Pt aware of risks not limited to but including infection, bleeding, damage to near by organs.  Meds, vitals, and allergies reviewed.  Indication: suspected abscess Pt complaints of: erythema, pain, swelling Location: Left anterior lower leg  Size: 2.5 cm  Prep: etoh/betadine Anesthesia: 1%lidocaine with epi Incision made with #11 blade Wound explored and loculations removed  Tolerated well without significant blood loss or obvious complications Sterilized bandage applied to affected area  Assessment and Plan:   Abscess No red flags or evidence of systemic infection Performed I&D procedure. Patient tolerated well.  Start doxycycline 100 mg twice daily  May use ibuprofen every 6-8 hours as needed for additional relief  Aftercare, including wound care, risks of bleeding and infection were discussed. All questions answered.  Return for wound check as needed for further evaluation and management.   Follow up if new/worsening symptoms occurs   I,Leslie Cervantes,acting as a scribe for June, PA.,have documented all relevant documentation on the behalf of [employer], PA,as directed by  Leslie Motto, PA while in the presence of Leslie Cervantes, Leslie Cervantes.  I, Leslie Cervantes, Leslie Cervantes, have reviewed all documentation for this visit. The documentation on 03/12/21 for the exam, diagnosis, procedures, and orders are all accurate and complete.  03/14/21, PA-C

## 2021-03-13 MED ORDER — DOXYCYCLINE HYCLATE 100 MG PO TABS: 100.0000 mg | ORAL_TABLET | Freq: Two times a day (BID) | ORAL | 0 refills | Status: DC

## 2021-04-09 ENCOUNTER — Other Ambulatory Visit: Payer: Self-pay

## 2021-04-09 ENCOUNTER — Encounter: Payer: Self-pay | Admitting: Internal Medicine

## 2021-04-09 ENCOUNTER — Ambulatory Visit: Payer: 59 | Admitting: Internal Medicine

## 2021-04-09 DIAGNOSIS — F411 Generalized anxiety disorder: Secondary | ICD-10-CM | POA: Diagnosis not present

## 2021-04-09 DIAGNOSIS — F332 Major depressive disorder, recurrent severe without psychotic features: Secondary | ICD-10-CM

## 2021-04-09 DIAGNOSIS — F5104 Psychophysiologic insomnia: Secondary | ICD-10-CM

## 2021-04-09 MED ORDER — VILAZODONE HCL 10 MG PO TABS: 10.0000 mg | ORAL_TABLET | Freq: Every day | ORAL | 0 refills | Status: DC

## 2021-04-09 MED ORDER — VILAZODONE HCL 20 MG PO TABS: 20.0000 mg | ORAL_TABLET | Freq: Every day | ORAL | 0 refills | Status: DC

## 2021-04-09 NOTE — Patient Instructions (Addendum)
We will have you stop taking the abilify. Keep taking the zoloft.   We will send in the viibryd to take 10 mg daily for the first week.  Then increase to 20 mg daily viibryd for the next 1 month.  Then come back and we will see how this is working.

## 2021-04-09 NOTE — Progress Notes (Signed)
° °  Subjective:   Patient ID: Leslie Cervantes, female    DOB: 06/17/1990, 30 y.o.   MRN: [id number]  HPI The patient is a 31 YO female coming in for follow up mood. Rx for abilify last Oct 2022 and no follow up since. She does not think it is helping and mood is worst it has ever been. Has not followed up with finding a psychiatrist at all yet.   Review of Systems  Constitutional:  Positive for activity change, appetite change and fatigue.  HENT: Negative.    Eyes: Negative.   Respiratory:  Negative for cough, chest tightness and shortness of breath.   Cardiovascular:  Negative for chest pain, palpitations and leg swelling.  Gastrointestinal:  Negative for abdominal distention, abdominal pain, constipation, diarrhea, nausea and vomiting.  Musculoskeletal: Negative.   Skin: Negative.   Neurological: Negative.   Psychiatric/Behavioral:  Positive for decreased concentration, dysphoric mood and sleep disturbance. The patient is nervous/anxious.    Objective:  Physical Exam Constitutional:      Appearance: She is well-developed.  HENT:     Head: Normocephalic and atraumatic.  Cardiovascular:     Rate and Rhythm: Normal rate and regular rhythm.  Pulmonary:     Effort: Pulmonary effort is normal. No respiratory distress.     Breath sounds: Normal breath sounds. No wheezing or rales.  Abdominal:     General: Bowel sounds are normal. There is no distension.     Palpations: Abdomen is soft.     Tenderness: There is no abdominal tenderness. There is no rebound.  Musculoskeletal:     Cervical back: Normal range of motion.  Skin:    General: Skin is warm and dry.  Neurological:     Mental Status: She is alert and oriented to person, place, and time.     Coordination: Coordination normal.  Psychiatric:     Comments: Flat affect    Vitals:   04/09/21 1017  BP: 112/62  Pulse: 84  Temp: 98.7 F (37.1 C)  TempSrc: Oral  SpO2: 99%  Weight: 135 lb (61.2 kg)  Height: 5\' 4"  (1.626 m)     This visit occurred during the SARS-CoV-2 public health emergency.  Safety protocols were in place, including screening questions prior to the visit, additional usage of staff PPE, and extensive cleaning of exam room while observing appropriate contact time as indicated for disinfecting solutions.   Assessment & Plan:  Visit time 25 minutes in face to face communication with patient and coordination of care, additional 15 minutes spent in record review, coordination or care, ordering tests, communicating/referring to other healthcare professionals, documenting in medical records all on the same day of the visit for total time 40 minutes spent on the visit.

## 2021-04-12 DIAGNOSIS — G4700 Insomnia, unspecified: Secondary | ICD-10-CM | POA: Insufficient documentation

## 2021-04-12 NOTE — Assessment & Plan Note (Signed)
Moderate exacerbation of her anxiety and severe of her depression. We will stop abilify as it was not effective. We will start viibryd 10 mg daily for 1 week then increase to 20 mg daily for 3 weeks then have follow up. Continue zoloft for now and will plan to wean once therapeutic with viibryd. ?

## 2021-04-12 NOTE — Assessment & Plan Note (Addendum)
Recurrent and severe. She previously was moderate flare and we added abilify and continued zoloft 100 mg daily. She has had deterioration of her mood and is severe (worst her mood has ever been). Exceptionally hard to get out of bed and motivate. Some anxiety symptoms as well. We previously had tried to get trintellix approved due to trying and failing lexapro, pristiq, prozac, wellbutrin, and zoloft in the past as well as now abilify has been ineffective. Rx viibryd to see if this can be approved. She was reminded that she would likely benefit from seeing a psychiatrist and she is asked to find one in her insurance network. Rx viibryd 10 mg daily for 1 week, then increase to 20 mg daily for 3 weeks then return. She did not follow up with [place] electronically so we will schedule in person follow up visit as her mental health is severely bad currently. No SI/HI or psychosis. She is taking trazodone 50 mg qhs a few times a week and is sleeping okay currently.  ?

## 2021-04-12 NOTE — Assessment & Plan Note (Signed)
She is using trazodone 50 mg qhs 3-4 times a week with good results. We had hoped that this would also help with mood however this has had no impact on mood. We will continue for sleep only at current dosing 50 mg qhs prn for sleep. ?

## 2021-05-29 ENCOUNTER — Ambulatory Visit: Payer: 59 | Admitting: Internal Medicine

## 2021-05-29 ENCOUNTER — Encounter: Payer: Self-pay | Admitting: Internal Medicine

## 2021-05-29 DIAGNOSIS — F411 Generalized anxiety disorder: Secondary | ICD-10-CM | POA: Diagnosis not present

## 2021-05-29 DIAGNOSIS — F332 Major depressive disorder, recurrent severe without psychotic features: Secondary | ICD-10-CM

## 2021-05-29 MED ORDER — PAROXETINE HCL 10 MG PO TABS: 10.0000 mg | ORAL_TABLET | Freq: Every day | ORAL | 2 refills | Status: DC

## 2021-05-29 NOTE — Patient Instructions (Signed)
We have sent in paxil to take 1 pill daily for first week. Then increase to 2 pills daily until you come back in 1 month. ? ? ?

## 2021-05-29 NOTE — Assessment & Plan Note (Signed)
Severe and uncontrolled. Did not have any benefit to viibryd. No side effects. She has tried and failed abilify, lexapro, pristiq, prozac, wellbutrin. She is currently on zoloft 100 mg daily and does not feel it is benefiting her. We will add paxil 10 mg daily for 1 week then increase to 20 mg daily for 1 month then return. She did not make visit with psychiatrist as requested. We are limited by options as she has tried and failed many different medications in the past and has severe symptoms. Asked her to look into this again. No SI/HI or psychosis.  ?

## 2021-05-29 NOTE — Assessment & Plan Note (Signed)
Worse at this time. We will keep zoloft 100 mg daily and add paxil 10 mg daily for 1 week then increase to 20 mg daily. We will not stop zoloft for now given severity of symptoms.  ?

## 2021-05-29 NOTE — Progress Notes (Signed)
? ?  Subjective:  ? ?Patient ID: Leslie Cervantes, female    DOB: 23-Aug-1990, 31 y.o.   MRN: [id number] ? ?HPI ?The patient is a 31 YO female coming in for follow up starting viibyrd. She did not do well. No SI but severe depression/anxiety. She did try to get a psychiatrist but they were scheduling for Nov so she did not schedule apt. ? ?Review of Systems  ?Constitutional: Negative.   ?HENT: Negative.    ?Eyes: Negative.   ?Respiratory:  Negative for cough, chest tightness and shortness of breath.   ?Cardiovascular:  Negative for chest pain, palpitations and leg swelling.  ?Gastrointestinal:  Negative for abdominal distention, abdominal pain, constipation, diarrhea, nausea and vomiting.  ?Musculoskeletal: Negative.   ?Skin: Negative.   ?Neurological: Negative.   ?Psychiatric/Behavioral:  Positive for decreased concentration and dysphoric mood. Negative for sleep disturbance. The patient is nervous/anxious.   ? ?Objective:  ?Physical Exam ?Constitutional:   ?   Appearance: She is well-developed.  ?HENT:  ?   Head: Normocephalic and atraumatic.  ?Cardiovascular:  ?   Rate and Rhythm: Normal rate and regular rhythm.  ?Pulmonary:  ?   Effort: Pulmonary effort is normal. No respiratory distress.  ?   Breath sounds: Normal breath sounds. No wheezing or rales.  ?Abdominal:  ?   General: Bowel sounds are normal. There is no distension.  ?   Palpations: Abdomen is soft.  ?   Tenderness: There is no abdominal tenderness. There is no rebound.  ?Musculoskeletal:  ?   Cervical back: Normal range of motion.  ?Skin: ?   General: Skin is warm and dry.  ?Neurological:  ?   Mental Status: She is alert and oriented to person, place, and time.  ?   Coordination: Coordination normal.  ? ? ?Vitals:  ? 05/29/21 0820  ?BP: 118/80  ?Pulse: 77  ?Resp: 18  ?SpO2: 97%  ?Weight: 137 lb 9.6 oz (62.4 kg)  ?Height: 5\' 4"  (1.626 m)  ? ? ?This visit occurred during the SARS-CoV-2 public health emergency.  Safety protocols were in place, including  screening questions prior to the visit, additional usage of staff PPE, and extensive cleaning of exam room while observing appropriate contact time as indicated for disinfecting solutions.  ? ?Assessment & Plan:  ? ?

## 2021-06-01 ENCOUNTER — Other Ambulatory Visit: Payer: Self-pay | Admitting: Internal Medicine

## 2021-06-09 ENCOUNTER — Encounter: Payer: Self-pay | Admitting: Internal Medicine

## 2021-06-26 ENCOUNTER — Other Ambulatory Visit: Payer: Self-pay | Admitting: Internal Medicine

## 2021-09-04 ENCOUNTER — Other Ambulatory Visit: Payer: Self-pay | Admitting: Internal Medicine

## 2021-10-01 ENCOUNTER — Encounter: Payer: Self-pay | Admitting: Internal Medicine

## 2021-10-19 ENCOUNTER — Other Ambulatory Visit: Payer: Self-pay | Admitting: Internal Medicine

## 2021-10-19 NOTE — Telephone Encounter (Signed)
We need follow up information. She did not follow up after 4/23 on if/how well this was working.

## 2021-10-19 NOTE — Telephone Encounter (Signed)
We don't necessarily need a visit just info on how she is doing and what dose she is taking

## 2021-10-22 ENCOUNTER — Other Ambulatory Visit: Payer: Self-pay | Admitting: Internal Medicine

## 2021-10-23 ENCOUNTER — Telehealth (INDEPENDENT_AMBULATORY_CARE_PROVIDER_SITE_OTHER): Payer: 59 | Admitting: Internal Medicine

## 2021-10-23 ENCOUNTER — Encounter: Payer: Self-pay | Admitting: Internal Medicine

## 2021-10-23 DIAGNOSIS — F331 Major depressive disorder, recurrent, moderate: Secondary | ICD-10-CM

## 2021-10-23 DIAGNOSIS — F411 Generalized anxiety disorder: Secondary | ICD-10-CM | POA: Diagnosis not present

## 2021-10-23 MED ORDER — MONTELUKAST SODIUM 10 MG PO TABS: 10.0000 mg | ORAL_TABLET | Freq: Every day | ORAL | 3 refills | Status: AC

## 2021-10-23 MED ORDER — PAROXETINE HCL 10 MG PO TABS: ORAL_TABLET | ORAL | 1 refills | Status: AC

## 2021-10-23 NOTE — Progress Notes (Signed)
Virtual Visit via Video Note  I connected with Leslie Cervantes on 10/23/21 at  9:40 AM EDT by a video enabled telemedicine application and verified that I am speaking with the correct person using two identifiers.  The patient and the provider were at separate locations throughout the entire encounter. Patient location: home, Provider location: work   I discussed the limitations of evaluation and management by telemedicine and the availability of in person appointments. The patient expressed understanding and agreed to proceed. The patient and the provider were the only parties present for the visit unless noted in HPI below.  History of Present Illness: The patient is a 31 y.o. female with visit for GAS/depression.   Observations/Objective: Appearance: normal, breathing appears normal, casual grooming, mental status is A and O times 3  Assessment and Plan: See problem oriented charting  Follow Up Instructions: Refill paxil and montelukast, plan paxil 10 mg daily for 1 month. If effective keep same dosing paxil and attempt wean zoloft dosing.  I discussed the assessment and treatment plan with the patient. The patient was provided an opportunity to ask questions and all were answered. The patient agreed with the plan and demonstrated an understanding of the instructions.   The patient was advised to call back or seek an in-person evaluation if the symptoms worsen or if the condition fails to improve as anticipated.  Leslie Broker, MD

## 2021-10-23 NOTE — Assessment & Plan Note (Signed)
Did start paxil and increase to 20 mg daily. Has been on that for about 4 months. Ran out last week and has been feeling more mood up and down but feeling more happiness when mood is up. We will resume paxil at 10 mg dose and keep zoloft 100 mg daily. At 1 month if paxil 10 mg daily okay will attempt to wean zoloft to 50 mg daily for 2 months then to 25 mg daily for 2 months then off if doing well.  

## 2021-10-23 NOTE — Assessment & Plan Note (Signed)
Did start paxil and increase to 20 mg daily. Has been on that for about 4 months. Ran out last week and has been feeling more mood up and down but feeling more happiness when mood is up. We will resume paxil at 10 mg dose and keep zoloft 100 mg daily. At 1 month if paxil 10 mg daily okay will attempt to wean zoloft to 50 mg daily for 2 months then to 25 mg daily for 2 months then off if doing well.

## 2021-10-29 ENCOUNTER — Encounter: Payer: Self-pay | Admitting: Internal Medicine

## 2021-10-30 ENCOUNTER — Ambulatory Visit: Payer: 59 | Admitting: Internal Medicine

## 2022-07-24 IMAGING — DX DG RIBS W/ CHEST 3+V*R*
3 series · 3 of 3 positions shown · non-contrast
Comparison: None.

CLINICAL DATA: Rib injury

EXAM:
RIGHT RIBS AND CHEST - 3+ VIEW

[chest pa]
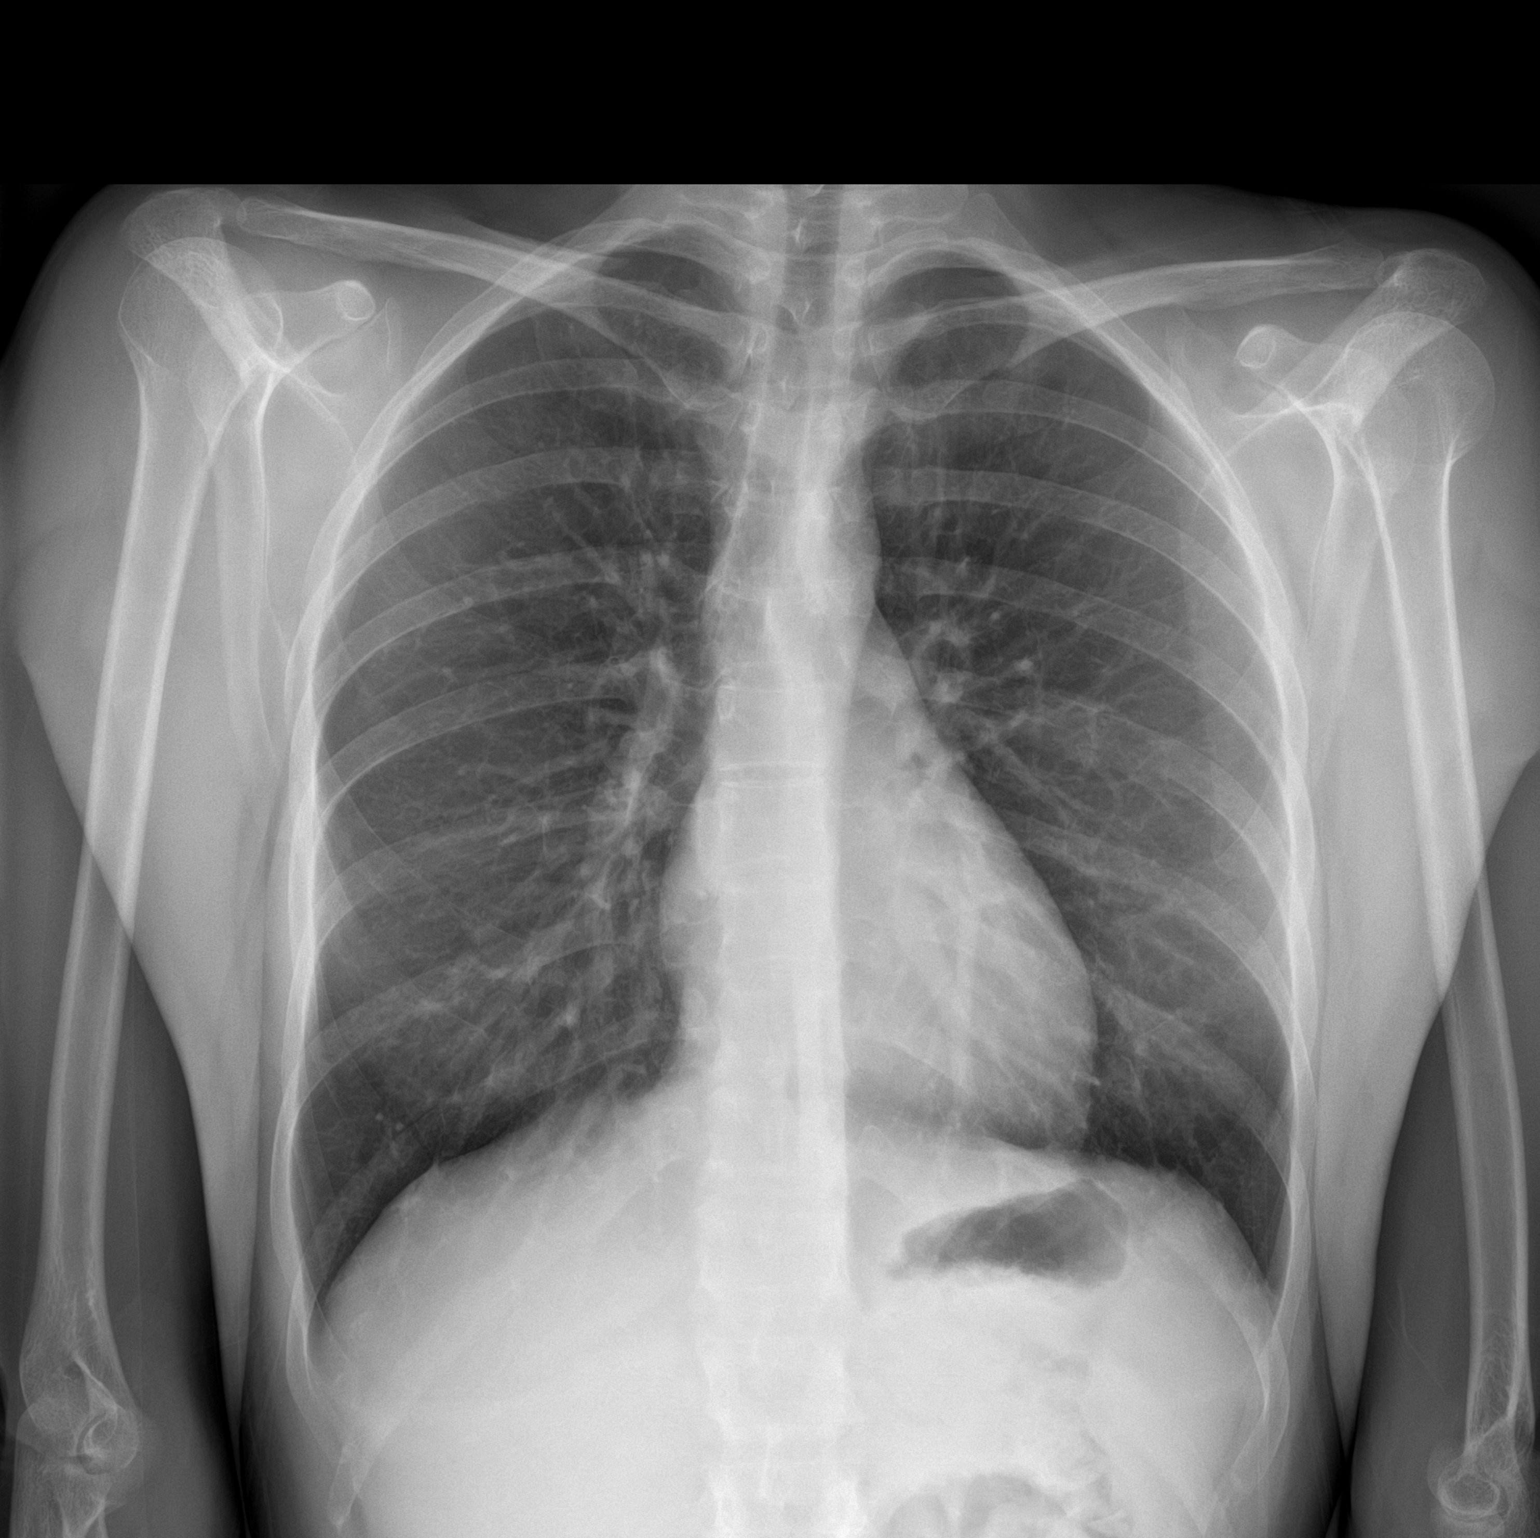

[rib pa]
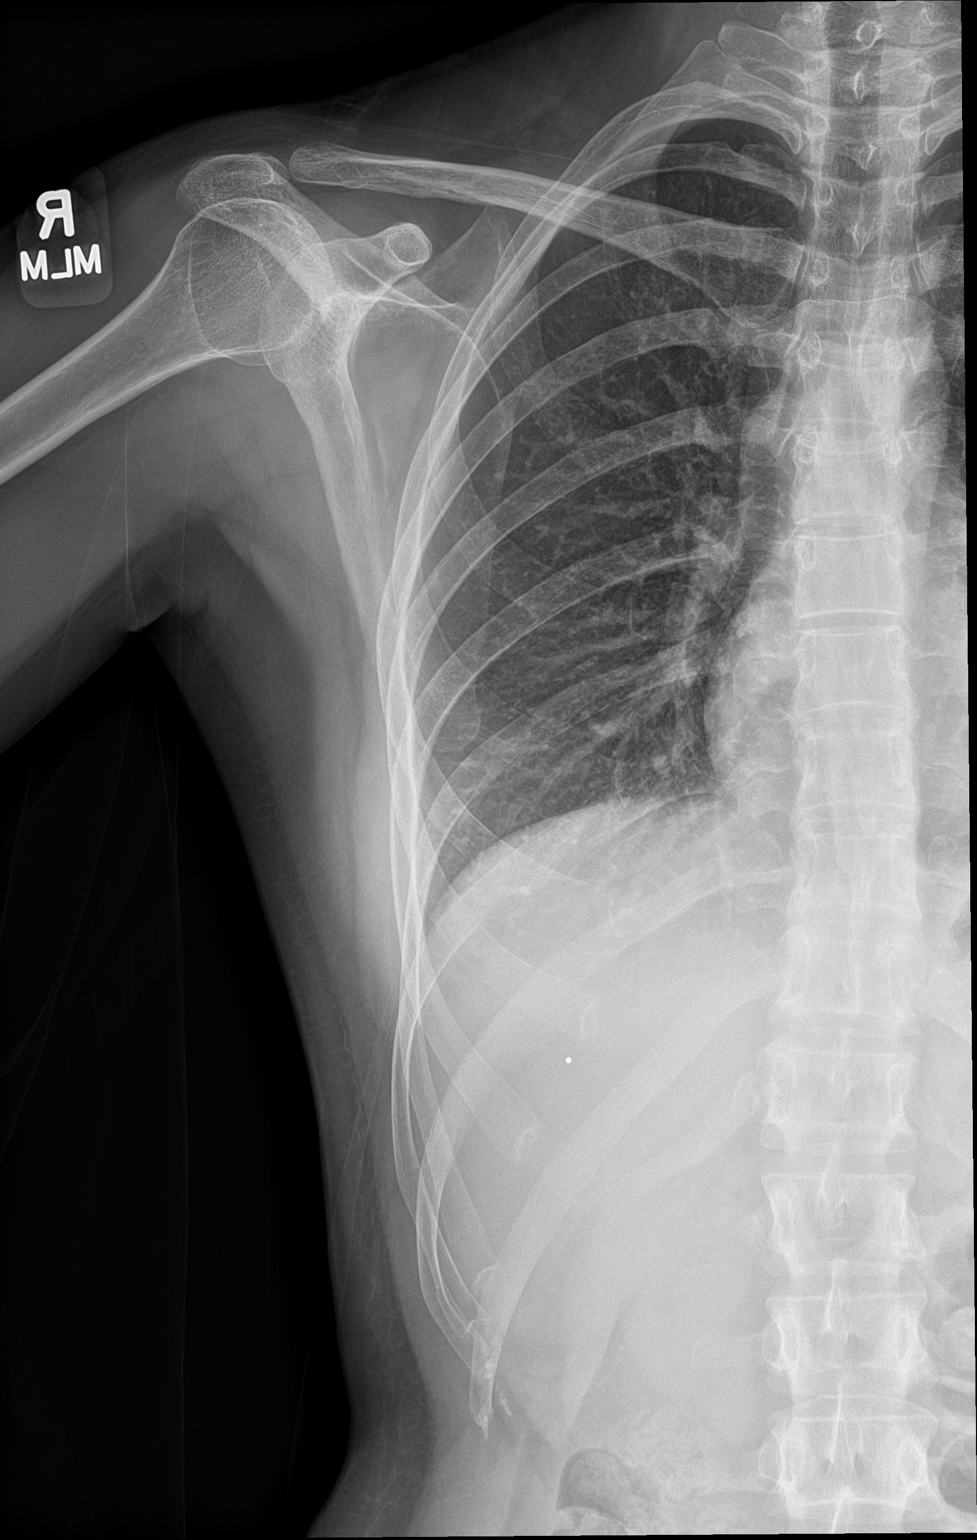

[rib obl]
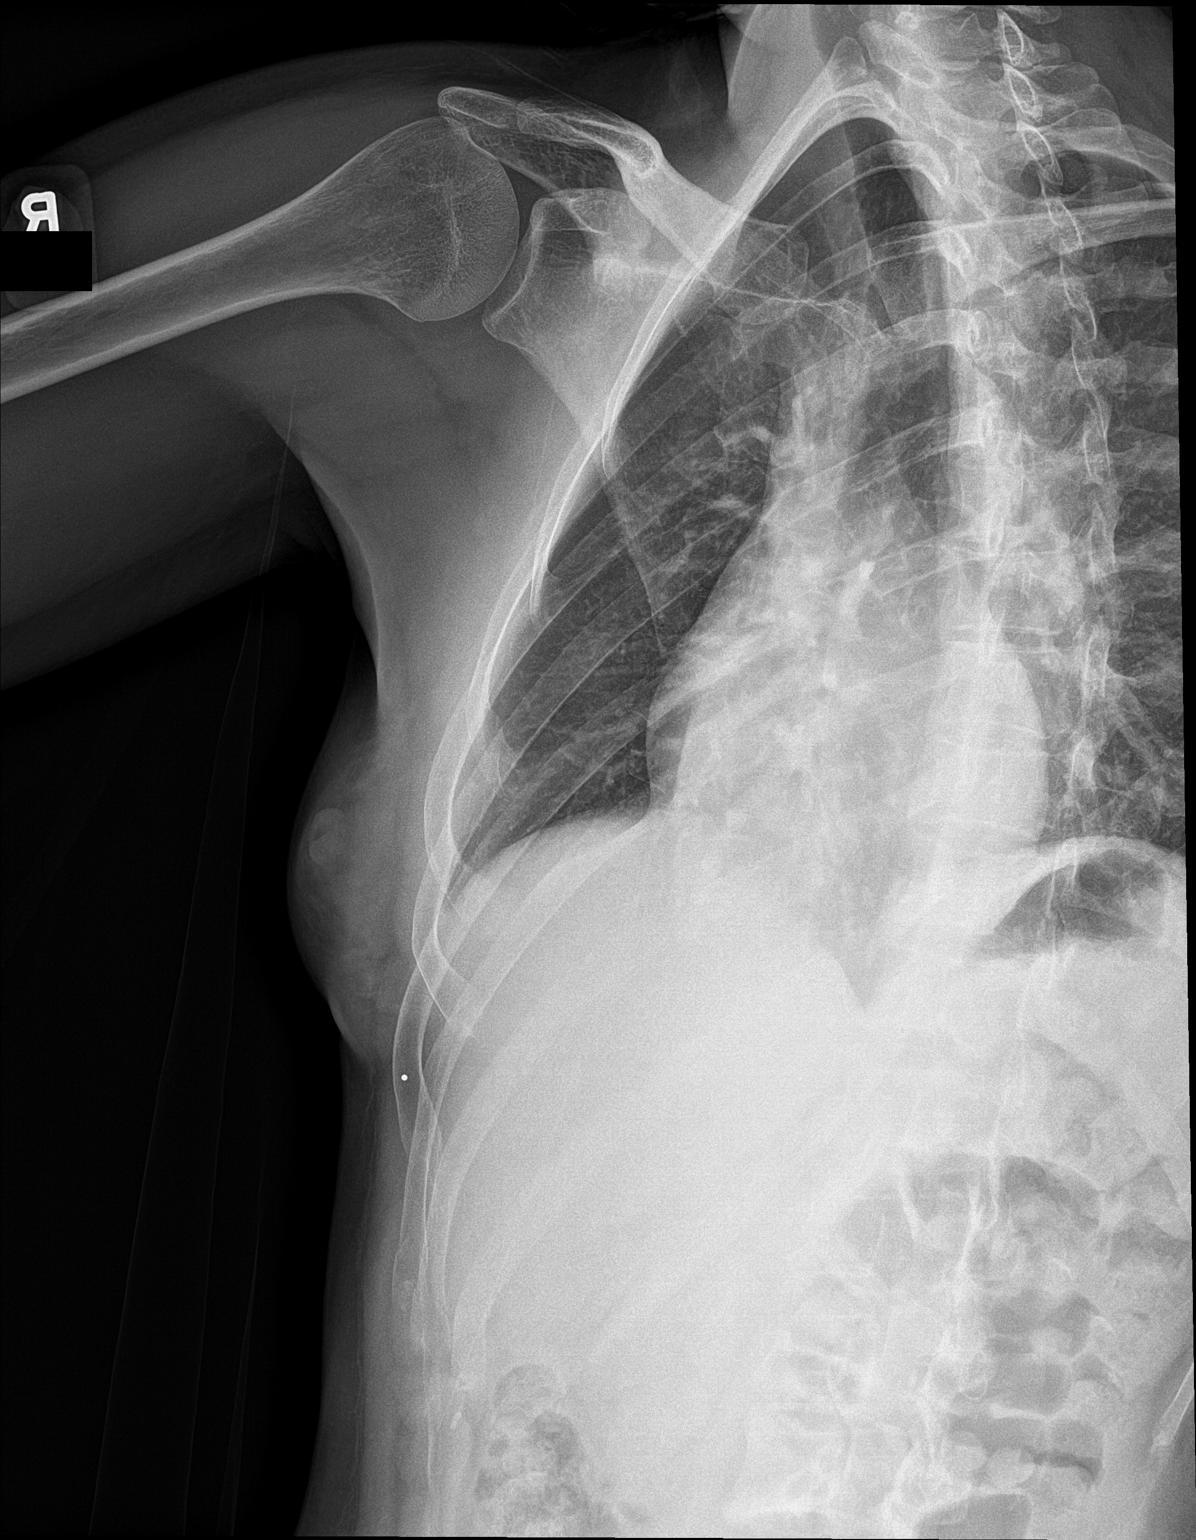

[3 of 3 positions shown; findings below may reference images not displayed]

FINDINGS: No fracture or other bone lesions are seen involving the ribs. There
is no evidence of pneumothorax or pleural effusion. Both lungs are
clear. Heart size and mediastinal contours are within normal limits.
IMPRESSION: Negative.
